# Patient Record
Sex: Male | Born: 1975 | Race: White | Hispanic: No | Marital: Single | Smoking: Former smoker
Health system: Southern US, Community
[De-identification: ages and names within clinical notes are randomized; demographics above are authoritative.]

## PROBLEM LIST (undated history)

## (undated) DIAGNOSIS — H5440 Blindness, one eye, unspecified eye: Secondary | ICD-10-CM

## (undated) DIAGNOSIS — I509 Heart failure, unspecified: Secondary | ICD-10-CM

## (undated) DIAGNOSIS — I639 Cerebral infarction, unspecified: Secondary | ICD-10-CM

## (undated) DIAGNOSIS — E669 Obesity, unspecified: Secondary | ICD-10-CM

## (undated) HISTORY — PX: EYE SURGERY: SHX253

---

## 2001-04-09 ENCOUNTER — Encounter: Payer: Self-pay | Admitting: Emergency Medicine

## 2001-04-09 ENCOUNTER — Emergency Department (HOSPITAL_COMMUNITY): Admission: EM | Admit: 2001-04-09 | Discharge: 2001-04-10 | Payer: Self-pay | Admitting: Emergency Medicine

## 2002-10-08 ENCOUNTER — Emergency Department (HOSPITAL_COMMUNITY): Admission: EM | Admit: 2002-10-08 | Discharge: 2002-10-08 | Payer: Self-pay | Admitting: Emergency Medicine

## 2002-12-31 ENCOUNTER — Emergency Department (HOSPITAL_COMMUNITY): Admission: EM | Admit: 2002-12-31 | Discharge: 2002-12-31 | Payer: Self-pay | Admitting: Emergency Medicine

## 2003-02-08 ENCOUNTER — Emergency Department (HOSPITAL_COMMUNITY): Admission: EM | Admit: 2003-02-08 | Discharge: 2003-02-08 | Payer: Self-pay | Admitting: Emergency Medicine

## 2003-04-13 ENCOUNTER — Encounter: Payer: Self-pay | Admitting: Emergency Medicine

## 2003-04-13 ENCOUNTER — Emergency Department (HOSPITAL_COMMUNITY): Admission: EM | Admit: 2003-04-13 | Discharge: 2003-04-14 | Payer: Self-pay

## 2003-04-14 ENCOUNTER — Encounter: Payer: Self-pay | Admitting: Emergency Medicine

## 2003-08-24 ENCOUNTER — Emergency Department (HOSPITAL_COMMUNITY): Admission: EM | Admit: 2003-08-24 | Discharge: 2003-08-24 | Payer: Self-pay | Admitting: Emergency Medicine

## 2003-09-28 ENCOUNTER — Emergency Department (HOSPITAL_COMMUNITY): Admission: EM | Admit: 2003-09-28 | Discharge: 2003-09-28 | Payer: Self-pay | Admitting: Emergency Medicine

## 2003-10-28 ENCOUNTER — Emergency Department (HOSPITAL_COMMUNITY): Admission: EM | Admit: 2003-10-28 | Discharge: 2003-10-28 | Payer: Self-pay | Admitting: Emergency Medicine

## 2005-04-27 ENCOUNTER — Emergency Department (HOSPITAL_COMMUNITY): Admission: EM | Admit: 2005-04-27 | Discharge: 2005-04-28 | Payer: Self-pay | Admitting: Emergency Medicine

## 2005-07-26 ENCOUNTER — Emergency Department (HOSPITAL_COMMUNITY): Admission: EM | Admit: 2005-07-26 | Discharge: 2005-07-26 | Payer: Self-pay | Admitting: Emergency Medicine

## 2005-07-29 ENCOUNTER — Emergency Department (HOSPITAL_COMMUNITY): Admission: EM | Admit: 2005-07-29 | Discharge: 2005-07-29 | Payer: Self-pay | Admitting: Family Medicine

## 2005-11-25 ENCOUNTER — Emergency Department (HOSPITAL_COMMUNITY): Admission: EM | Admit: 2005-11-25 | Discharge: 2005-11-25 | Payer: Self-pay | Admitting: Family Medicine

## 2005-12-31 ENCOUNTER — Emergency Department (HOSPITAL_COMMUNITY): Admission: EM | Admit: 2005-12-31 | Discharge: 2005-12-31 | Payer: Self-pay | Admitting: Emergency Medicine

## 2006-02-05 ENCOUNTER — Emergency Department (HOSPITAL_COMMUNITY): Admission: EM | Admit: 2006-02-05 | Discharge: 2006-02-05 | Payer: Self-pay | Admitting: Emergency Medicine

## 2006-02-07 ENCOUNTER — Emergency Department (HOSPITAL_COMMUNITY): Admission: EM | Admit: 2006-02-07 | Discharge: 2006-02-07 | Payer: Self-pay | Admitting: Emergency Medicine

## 2006-07-21 ENCOUNTER — Emergency Department (HOSPITAL_COMMUNITY): Admission: EM | Admit: 2006-07-21 | Discharge: 2006-07-21 | Payer: Self-pay | Admitting: Emergency Medicine

## 2006-09-08 ENCOUNTER — Ambulatory Visit: Payer: Self-pay | Admitting: Internal Medicine

## 2006-09-08 LAB — CONVERTED CEMR LAB
Basophils Absolute: 0 10*3/uL (ref 0.0–0.1)
Bilirubin, Direct: 0.1 mg/dL (ref 0.0–0.3)
CO2: 31 meq/L (ref 19–32)
Eosinophils Absolute: 0.2 10*3/uL (ref 0.0–0.6)
Eosinophils Relative: 3 % (ref 0.0–5.0)
GFR calc Af Amer: 126 mL/min
Glucose, Bld: 111 mg/dL — ABNORMAL HIGH (ref 70–99)
Hemoglobin, Urine: NEGATIVE
Ketones, ur: NEGATIVE mg/dL
Leukocytes, UA: NEGATIVE
Lymphocytes Relative: 33.2 % (ref 12.0–46.0)
MCHC: 34.9 g/dL (ref 30.0–36.0)
MCV: 83.6 fL (ref 78.0–100.0)
Mucus, UA: NEGATIVE
Neutro Abs: 3.8 10*3/uL (ref 1.4–7.7)
Neutrophils Relative %: 52.9 % (ref 43.0–77.0)
Platelets: 269 10*3/uL (ref 150–400)
Potassium: 4.2 meq/L (ref 3.5–5.1)
RBC / HPF: NONE SEEN
Sodium: 138 meq/L (ref 135–145)
TSH: 1.29 microintl units/mL (ref 0.35–5.50)
Total Protein, Urine: NEGATIVE mg/dL
Total Protein: 6.9 g/dL (ref 6.0–8.3)
Urine Glucose: NEGATIVE mg/dL
Urobilinogen, UA: 1 (ref 0.0–1.0)
WBC: 7.1 10*3/uL (ref 4.5–10.5)

## 2006-09-15 ENCOUNTER — Ambulatory Visit: Payer: Self-pay | Admitting: Internal Medicine

## 2006-09-22 ENCOUNTER — Encounter: Admission: RE | Admit: 2006-09-22 | Discharge: 2006-12-21 | Payer: Self-pay | Admitting: Internal Medicine

## 2006-09-29 ENCOUNTER — Encounter: Payer: Self-pay | Admitting: Cardiology

## 2006-09-29 ENCOUNTER — Ambulatory Visit: Payer: Self-pay

## 2006-10-17 ENCOUNTER — Emergency Department (HOSPITAL_COMMUNITY): Admission: EM | Admit: 2006-10-17 | Discharge: 2006-10-17 | Payer: Self-pay | Admitting: Family Medicine

## 2006-11-15 ENCOUNTER — Ambulatory Visit: Payer: Self-pay | Admitting: Internal Medicine

## 2006-11-16 ENCOUNTER — Ambulatory Visit: Payer: Self-pay | Admitting: Pulmonary Disease

## 2006-11-30 ENCOUNTER — Ambulatory Visit (HOSPITAL_BASED_OUTPATIENT_CLINIC_OR_DEPARTMENT_OTHER): Admission: RE | Admit: 2006-11-30 | Discharge: 2006-11-30 | Payer: Self-pay | Admitting: Pulmonary Disease

## 2006-12-13 ENCOUNTER — Ambulatory Visit: Payer: Self-pay | Admitting: Pulmonary Disease

## 2006-12-15 ENCOUNTER — Ambulatory Visit: Payer: Self-pay | Admitting: Pulmonary Disease

## 2007-01-05 ENCOUNTER — Emergency Department (HOSPITAL_COMMUNITY): Admission: EM | Admit: 2007-01-05 | Discharge: 2007-01-06 | Payer: Self-pay | Admitting: Emergency Medicine

## 2007-03-30 ENCOUNTER — Ambulatory Visit: Payer: Self-pay | Admitting: Internal Medicine

## 2007-03-31 DIAGNOSIS — J309 Allergic rhinitis, unspecified: Secondary | ICD-10-CM | POA: Insufficient documentation

## 2007-03-31 DIAGNOSIS — E785 Hyperlipidemia, unspecified: Secondary | ICD-10-CM | POA: Insufficient documentation

## 2007-03-31 DIAGNOSIS — E669 Obesity, unspecified: Secondary | ICD-10-CM

## 2007-03-31 DIAGNOSIS — G4733 Obstructive sleep apnea (adult) (pediatric): Secondary | ICD-10-CM

## 2007-03-31 DIAGNOSIS — G43909 Migraine, unspecified, not intractable, without status migrainosus: Secondary | ICD-10-CM | POA: Insufficient documentation

## 2007-04-08 ENCOUNTER — Encounter: Payer: Self-pay | Admitting: Internal Medicine

## 2007-04-19 ENCOUNTER — Emergency Department (HOSPITAL_COMMUNITY): Admission: EM | Admit: 2007-04-19 | Discharge: 2007-04-19 | Payer: Self-pay | Admitting: Emergency Medicine

## 2007-07-04 ENCOUNTER — Emergency Department (HOSPITAL_COMMUNITY): Admission: EM | Admit: 2007-07-04 | Discharge: 2007-07-04 | Payer: Self-pay | Admitting: Emergency Medicine

## 2007-08-01 ENCOUNTER — Encounter (INDEPENDENT_AMBULATORY_CARE_PROVIDER_SITE_OTHER): Payer: Self-pay | Admitting: *Deleted

## 2007-08-01 ENCOUNTER — Encounter: Payer: Self-pay | Admitting: Internal Medicine

## 2007-08-01 ENCOUNTER — Ambulatory Visit: Payer: Self-pay | Admitting: Internal Medicine

## 2007-08-01 DIAGNOSIS — R5383 Other fatigue: Secondary | ICD-10-CM

## 2007-08-01 DIAGNOSIS — R5381 Other malaise: Secondary | ICD-10-CM | POA: Insufficient documentation

## 2007-08-01 DIAGNOSIS — F329 Major depressive disorder, single episode, unspecified: Secondary | ICD-10-CM

## 2007-08-01 DIAGNOSIS — R109 Unspecified abdominal pain: Secondary | ICD-10-CM

## 2007-08-01 DIAGNOSIS — L738 Other specified follicular disorders: Secondary | ICD-10-CM

## 2007-08-01 DIAGNOSIS — F3289 Other specified depressive episodes: Secondary | ICD-10-CM | POA: Insufficient documentation

## 2007-08-01 DIAGNOSIS — F411 Generalized anxiety disorder: Secondary | ICD-10-CM

## 2007-08-01 LAB — CONVERTED CEMR LAB
Albumin: 3.8 g/dL (ref 3.5–5.2)
Alkaline Phosphatase: 63 units/L (ref 39–117)
BUN: 12 mg/dL (ref 6–23)
Basophils Relative: 0.9 % (ref 0.0–1.0)
Crystals: NEGATIVE
GFR calc Af Amer: 99 mL/min
GFR calc non Af Amer: 82 mL/min
Leukocytes, UA: NEGATIVE
Lymphocytes Relative: 33.1 % (ref 12.0–46.0)
Monocytes Relative: 9.7 % (ref 3.0–11.0)
Neutro Abs: 4.6 10*3/uL (ref 1.4–7.7)
Platelets: 280 10*3/uL (ref 150–400)
Potassium: 4.1 meq/L (ref 3.5–5.1)
RBC / HPF: NONE SEEN
RBC: 5.27 M/uL (ref 4.22–5.81)
Specific Gravity, Urine: 1.02 (ref 1.000–1.03)
Urine Glucose: NEGATIVE mg/dL
Urobilinogen, UA: 1 (ref 0.0–1.0)

## 2007-08-02 LAB — CONVERTED CEMR LAB

## 2007-08-30 ENCOUNTER — Emergency Department (HOSPITAL_COMMUNITY): Admission: EM | Admit: 2007-08-30 | Discharge: 2007-08-30 | Payer: Self-pay | Admitting: Family Medicine

## 2007-10-20 ENCOUNTER — Telehealth (INDEPENDENT_AMBULATORY_CARE_PROVIDER_SITE_OTHER): Payer: Self-pay | Admitting: *Deleted

## 2007-10-20 ENCOUNTER — Ambulatory Visit: Payer: Self-pay | Admitting: Internal Medicine

## 2007-10-20 DIAGNOSIS — M545 Low back pain, unspecified: Secondary | ICD-10-CM | POA: Insufficient documentation

## 2007-10-20 DIAGNOSIS — R21 Rash and other nonspecific skin eruption: Secondary | ICD-10-CM | POA: Insufficient documentation

## 2008-04-09 ENCOUNTER — Ambulatory Visit: Payer: Self-pay | Admitting: Internal Medicine

## 2008-04-09 DIAGNOSIS — G5600 Carpal tunnel syndrome, unspecified upper limb: Secondary | ICD-10-CM | POA: Insufficient documentation

## 2008-04-09 DIAGNOSIS — M25569 Pain in unspecified knee: Secondary | ICD-10-CM

## 2008-04-09 DIAGNOSIS — M5412 Radiculopathy, cervical region: Secondary | ICD-10-CM

## 2008-04-12 ENCOUNTER — Encounter: Admission: RE | Admit: 2008-04-12 | Discharge: 2008-04-12 | Payer: Self-pay | Admitting: Internal Medicine

## 2008-04-14 ENCOUNTER — Encounter: Admission: RE | Admit: 2008-04-14 | Discharge: 2008-04-14 | Payer: Self-pay | Admitting: Internal Medicine

## 2008-11-28 ENCOUNTER — Encounter: Payer: Self-pay | Admitting: Internal Medicine

## 2008-11-29 ENCOUNTER — Telehealth (INDEPENDENT_AMBULATORY_CARE_PROVIDER_SITE_OTHER): Payer: Self-pay | Admitting: *Deleted

## 2008-12-09 ENCOUNTER — Ambulatory Visit: Payer: Self-pay | Admitting: Endocrinology

## 2008-12-09 DIAGNOSIS — R0602 Shortness of breath: Secondary | ICD-10-CM | POA: Insufficient documentation

## 2008-12-09 DIAGNOSIS — R609 Edema, unspecified: Secondary | ICD-10-CM

## 2008-12-19 ENCOUNTER — Encounter: Payer: Self-pay | Admitting: Internal Medicine

## 2008-12-19 ENCOUNTER — Telehealth (INDEPENDENT_AMBULATORY_CARE_PROVIDER_SITE_OTHER): Payer: Self-pay | Admitting: *Deleted

## 2009-01-07 ENCOUNTER — Ambulatory Visit: Payer: Self-pay | Admitting: Cardiology

## 2009-01-07 DIAGNOSIS — R079 Chest pain, unspecified: Secondary | ICD-10-CM | POA: Insufficient documentation

## 2009-01-14 ENCOUNTER — Telehealth (INDEPENDENT_AMBULATORY_CARE_PROVIDER_SITE_OTHER): Payer: Self-pay | Admitting: Radiology

## 2009-01-15 ENCOUNTER — Ambulatory Visit: Payer: Self-pay

## 2009-01-15 ENCOUNTER — Encounter: Payer: Self-pay | Admitting: Cardiology

## 2009-01-16 ENCOUNTER — Encounter: Payer: Self-pay | Admitting: Cardiology

## 2009-01-16 ENCOUNTER — Ambulatory Visit: Payer: Self-pay

## 2009-01-16 ENCOUNTER — Encounter: Admission: RE | Admit: 2009-01-16 | Discharge: 2009-02-17 | Payer: Self-pay | Admitting: Cardiology

## 2009-01-20 ENCOUNTER — Encounter: Payer: Self-pay | Admitting: Cardiology

## 2009-02-03 ENCOUNTER — Ambulatory Visit: Payer: Self-pay | Admitting: Internal Medicine

## 2009-02-03 DIAGNOSIS — J209 Acute bronchitis, unspecified: Secondary | ICD-10-CM | POA: Insufficient documentation

## 2009-02-03 DIAGNOSIS — R062 Wheezing: Secondary | ICD-10-CM

## 2009-02-05 ENCOUNTER — Telehealth (INDEPENDENT_AMBULATORY_CARE_PROVIDER_SITE_OTHER): Payer: Self-pay | Admitting: *Deleted

## 2009-02-20 ENCOUNTER — Telehealth: Payer: Self-pay | Admitting: Internal Medicine

## 2009-02-27 ENCOUNTER — Emergency Department (HOSPITAL_COMMUNITY): Admission: EM | Admit: 2009-02-27 | Discharge: 2009-02-27 | Payer: Self-pay | Admitting: Emergency Medicine

## 2009-02-28 ENCOUNTER — Ambulatory Visit: Payer: Self-pay | Admitting: Internal Medicine

## 2009-02-28 ENCOUNTER — Inpatient Hospital Stay (HOSPITAL_COMMUNITY): Admission: EM | Admit: 2009-02-28 | Discharge: 2009-03-01 | Payer: Self-pay | Admitting: Emergency Medicine

## 2009-02-28 ENCOUNTER — Ambulatory Visit: Payer: Self-pay | Admitting: Cardiology

## 2009-03-01 ENCOUNTER — Telehealth (INDEPENDENT_AMBULATORY_CARE_PROVIDER_SITE_OTHER): Payer: Self-pay | Admitting: *Deleted

## 2009-03-06 ENCOUNTER — Ambulatory Visit: Payer: Self-pay | Admitting: Internal Medicine

## 2009-03-06 DIAGNOSIS — R55 Syncope and collapse: Secondary | ICD-10-CM

## 2009-03-13 ENCOUNTER — Telehealth (INDEPENDENT_AMBULATORY_CARE_PROVIDER_SITE_OTHER): Payer: Self-pay | Admitting: *Deleted

## 2009-03-21 ENCOUNTER — Encounter: Payer: Self-pay | Admitting: Cardiology

## 2009-03-27 ENCOUNTER — Ambulatory Visit: Payer: Self-pay | Admitting: Internal Medicine

## 2009-03-27 DIAGNOSIS — L989 Disorder of the skin and subcutaneous tissue, unspecified: Secondary | ICD-10-CM | POA: Insufficient documentation

## 2009-03-31 ENCOUNTER — Telehealth: Payer: Self-pay | Admitting: Internal Medicine

## 2009-04-01 ENCOUNTER — Telehealth: Payer: Self-pay | Admitting: Internal Medicine

## 2009-04-03 ENCOUNTER — Ambulatory Visit: Payer: Self-pay | Admitting: Internal Medicine

## 2009-04-29 ENCOUNTER — Telehealth: Payer: Self-pay | Admitting: Internal Medicine

## 2009-05-14 ENCOUNTER — Ambulatory Visit: Payer: Self-pay | Admitting: Cardiovascular Disease

## 2009-05-14 ENCOUNTER — Emergency Department (HOSPITAL_COMMUNITY): Admission: EM | Admit: 2009-05-14 | Discharge: 2009-05-14 | Payer: Self-pay | Admitting: Emergency Medicine

## 2009-06-27 ENCOUNTER — Telehealth: Payer: Self-pay | Admitting: Internal Medicine

## 2009-06-27 ENCOUNTER — Encounter: Payer: Self-pay | Admitting: Internal Medicine

## 2009-06-30 ENCOUNTER — Ambulatory Visit: Payer: Self-pay | Admitting: Internal Medicine

## 2009-07-02 ENCOUNTER — Telehealth (INDEPENDENT_AMBULATORY_CARE_PROVIDER_SITE_OTHER): Payer: Self-pay | Admitting: *Deleted

## 2009-11-28 ENCOUNTER — Emergency Department (HOSPITAL_COMMUNITY): Admission: EM | Admit: 2009-11-28 | Discharge: 2009-11-28 | Payer: Self-pay | Admitting: Emergency Medicine

## 2009-12-01 ENCOUNTER — Telehealth (INDEPENDENT_AMBULATORY_CARE_PROVIDER_SITE_OTHER): Payer: Self-pay | Admitting: *Deleted

## 2009-12-13 ENCOUNTER — Emergency Department (HOSPITAL_COMMUNITY): Admission: EM | Admit: 2009-12-13 | Discharge: 2009-12-14 | Payer: Self-pay | Admitting: Emergency Medicine

## 2010-01-09 ENCOUNTER — Emergency Department (HOSPITAL_COMMUNITY): Admission: EM | Admit: 2010-01-09 | Discharge: 2010-01-09 | Payer: Self-pay | Admitting: Emergency Medicine

## 2010-01-24 ENCOUNTER — Emergency Department (HOSPITAL_COMMUNITY): Admission: EM | Admit: 2010-01-24 | Discharge: 2010-01-24 | Payer: Self-pay | Admitting: Emergency Medicine

## 2010-04-22 ENCOUNTER — Observation Stay (HOSPITAL_COMMUNITY): Admission: EM | Admit: 2010-04-22 | Discharge: 2010-04-23 | Payer: Self-pay | Admitting: Emergency Medicine

## 2010-04-22 ENCOUNTER — Ambulatory Visit: Payer: Self-pay | Admitting: Nurse Practitioner

## 2010-05-25 ENCOUNTER — Ambulatory Visit (HOSPITAL_BASED_OUTPATIENT_CLINIC_OR_DEPARTMENT_OTHER): Admission: RE | Admit: 2010-05-25 | Discharge: 2010-05-25 | Payer: Self-pay | Admitting: Internal Medicine

## 2010-05-30 ENCOUNTER — Ambulatory Visit: Payer: Self-pay | Admitting: Internal Medicine

## 2010-06-09 ENCOUNTER — Emergency Department (HOSPITAL_COMMUNITY): Admission: EM | Admit: 2010-06-09 | Discharge: 2010-06-09 | Payer: Self-pay | Admitting: Emergency Medicine

## 2010-07-09 ENCOUNTER — Encounter: Payer: Self-pay | Admitting: Internal Medicine

## 2010-08-30 ENCOUNTER — Encounter: Payer: Self-pay | Admitting: Sports Medicine

## 2010-08-31 ENCOUNTER — Encounter: Payer: Self-pay | Admitting: Internal Medicine

## 2010-08-31 ENCOUNTER — Encounter: Payer: Self-pay | Admitting: Cardiology

## 2010-09-06 ENCOUNTER — Emergency Department (HOSPITAL_COMMUNITY)
Admission: EM | Admit: 2010-09-06 | Discharge: 2010-09-06 | Payer: Self-pay | Source: Home / Self Care | Admitting: Emergency Medicine

## 2010-09-06 LAB — DIFFERENTIAL
Basophils Absolute: 0 10*3/uL (ref 0.0–0.1)
Basophils Relative: 0 % (ref 0–1)
Eosinophils Relative: 3 % (ref 0–5)
Lymphocytes Relative: 30 % (ref 12–46)
Neutro Abs: 5.4 10*3/uL (ref 1.7–7.7)

## 2010-09-06 LAB — CBC
HCT: 45.1 % (ref 39.0–52.0)
RDW: 14.3 % (ref 11.5–15.5)
WBC: 9 10*3/uL (ref 4.0–10.5)

## 2010-09-06 LAB — COMPREHENSIVE METABOLIC PANEL
ALT: 29 U/L (ref 0–53)
BUN: 9 mg/dL (ref 6–23)
Calcium: 8.7 mg/dL (ref 8.4–10.5)
Creatinine, Ser: 0.88 mg/dL (ref 0.4–1.5)
GFR calc non Af Amer: >60 mL/min (ref >60)
Glucose, Bld: 98 mg/dL (ref 70–99)
Sodium: 135 mEq/L (ref 135–145)
Total Protein: 7.2 g/dL (ref 6.0–8.3)

## 2010-09-06 LAB — POCT CARDIAC MARKERS
CKMB, poc: <1 ng/mL — ABNORMAL LOW (ref 1.0–8.0)
Myoglobin, poc: 42.4 ng/mL (ref 12–200)

## 2010-09-06 LAB — CONVERTED CEMR LAB
Chloride: 104 meq/L (ref 96–112)
Potassium: 4.3 meq/L (ref 3.5–5.1)
Pro B Natriuretic peptide (BNP): 18 pg/mL (ref 0.0–100.0)
TSH: 1.77 microintl units/mL (ref 0.35–5.50)

## 2010-09-06 LAB — BRAIN NATRIURETIC PEPTIDE: Pro B Natriuretic peptide (BNP): 105 pg/mL — ABNORMAL HIGH (ref 0.0–100.0)

## 2010-09-10 NOTE — Progress Notes (Signed)
Summary: FYI   Phone Note Call from Patient   Caller: Patient Summary of Call: Per pt, pt was seen in the ER over the weekend and stated that he was to followup with our office. I checked in E-Chart and didnt see anything that stated that he needed to follow-up with Cardiology. I spoke with Beverlyn Roux concerning the letter for dismissal from [employer]. She instructed me to inform the patient of the dismissal and that there is nothing in E-Chart confirming this appt to be made. I instructed the patient that if he was still having chest pain that he needed to return to the ER or Urgent care. 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 I again instructed the patient that if he was having chest pain or the same symptoms please seek help from the ER. Initial call taken by: Edman Circle,  December 01, 2009 1:18 PM

## 2010-09-16 NOTE — Letter (Signed)
Summary: Certified Letter  Certified Letter   Imported By: Lenard Forth 09/08/2010 16:08:19  _____________________________________________________________________  External Attachment:    Type:   Image     Comment:   External Document

## 2010-10-20 LAB — POCT CARDIAC MARKERS
CKMB, poc: <1 ng/mL — ABNORMAL LOW (ref 1.0–8.0)
Myoglobin, poc: 34.6 ng/mL (ref 12–200)
Troponin i, poc: <0.05 ng/mL (ref 0.00–0.09)

## 2010-10-20 LAB — CBC
HCT: 45.9 % (ref 39.0–52.0)
Hemoglobin: 15.8 g/dL (ref 13.0–17.0)
MCH: 29.4 pg (ref 26.0–34.0)
MCHC: 34.4 g/dL (ref 30.0–36.0)
MCV: 85.5 fL (ref 78.0–100.0)
Platelets: 225 K/uL (ref 150–400)
RBC: 5.37 MIL/uL (ref 4.22–5.81)
RDW: 13.7 % (ref 11.5–15.5)
WBC: 7.7 10*3/uL (ref 4.0–10.5)

## 2010-10-20 LAB — BASIC METABOLIC PANEL
Calcium: 8.9 mg/dL (ref 8.4–10.5)
GFR calc Af Amer: >60 mL/min (ref >60)
GFR calc non Af Amer: >60 mL/min (ref >60)
Potassium: 3.9 mEq/L (ref 3.5–5.1)
Sodium: 139 mEq/L (ref 135–145)

## 2010-10-20 LAB — DIFFERENTIAL
Basophils Absolute: 0 K/uL (ref 0.0–0.1)
Basophils Relative: 0 % (ref 0–1)
Eosinophils Absolute: 0.2 K/uL (ref 0.0–0.7)
Eosinophils Relative: 2 % (ref 0–5)
Lymphocytes Relative: 34 % (ref 12–46)
Lymphs Abs: 2.6 K/uL (ref 0.7–4.0)
Monocytes Absolute: 0.7 10*3/uL (ref 0.1–1.0)
Monocytes Relative: 9 % (ref 3–12)
Neutro Abs: 4.3 10*3/uL (ref 1.7–7.7)
Neutrophils Relative %: 55 % (ref 43–77)

## 2010-10-20 LAB — BASIC METABOLIC PANEL WITH GFR
BUN: 13 mg/dL (ref 6–23)
CO2: 26 meq/L (ref 19–32)
Chloride: 106 meq/L (ref 96–112)
Creatinine, Ser: 0.83 mg/dL (ref 0.4–1.5)
Glucose, Bld: 104 mg/dL — ABNORMAL HIGH (ref 70–99)

## 2010-10-20 LAB — BRAIN NATRIURETIC PEPTIDE: Pro B Natriuretic peptide (BNP): <30 pg/mL (ref 0.0–100.0)

## 2010-10-22 LAB — BASIC METABOLIC PANEL
CO2: 26 mEq/L (ref 19–32)
Calcium: 8.7 mg/dL (ref 8.4–10.5)
Creatinine, Ser: 0.88 mg/dL (ref 0.4–1.5)
GFR calc Af Amer: >60 mL/min (ref >60)
GFR calc non Af Amer: >60 mL/min (ref >60)
Glucose, Bld: 112 mg/dL — ABNORMAL HIGH (ref 70–99)
Sodium: 139 mEq/L (ref 135–145)

## 2010-10-22 LAB — URINALYSIS, ROUTINE W REFLEX MICROSCOPIC
Bilirubin Urine: NEGATIVE
Glucose, UA: NEGATIVE mg/dL
Ketones, ur: NEGATIVE mg/dL
Specific Gravity, Urine: 1.026 (ref 1.005–1.030)
pH: 7 (ref 5.0–8.0)

## 2010-10-22 LAB — LIPID PANEL
Cholesterol: 166 mg/dL (ref 0–200)
LDL Cholesterol: 107 mg/dL — ABNORMAL HIGH (ref 0–99)
Triglycerides: 139 mg/dL (ref <150)
VLDL: 28 mg/dL (ref 0–40)

## 2010-10-22 LAB — CARDIAC PANEL(CRET KIN+CKTOT+MB+TROPI)
Relative Index: INVALID (ref 0.0–2.5)
Total CK: 52 U/L (ref 7–232)
Total CK: 54 U/L (ref 7–232)

## 2010-10-22 LAB — DIFFERENTIAL
Basophils Absolute: 0 10*3/uL (ref 0.0–0.1)
Basophils Relative: 0 % (ref 0–1)
Lymphocytes Relative: 36 % (ref 12–46)
Neutro Abs: 4.4 10*3/uL (ref 1.7–7.7)

## 2010-10-22 LAB — POCT CARDIAC MARKERS
CKMB, poc: <1 ng/mL — ABNORMAL LOW (ref 1.0–8.0)
Myoglobin, poc: 28.9 ng/mL (ref 12–200)
Troponin i, poc: <0.05 ng/mL (ref 0.00–0.09)

## 2010-10-22 LAB — CK TOTAL AND CKMB (NOT AT ARMC)
Relative Index: INVALID (ref 0.0–2.5)
Total CK: 61 U/L (ref 7–232)

## 2010-10-22 LAB — CBC
Hemoglobin: 14.3 g/dL (ref 13.0–17.0)
MCH: 29.3 pg (ref 26.0–34.0)
MCHC: 34.2 g/dL (ref 30.0–36.0)
Platelets: 212 10*3/uL (ref 150–400)
RBC: 4.88 MIL/uL (ref 4.22–5.81)

## 2010-10-22 LAB — PROTIME-INR: Prothrombin Time: 12.9 seconds (ref 11.6–15.2)

## 2010-10-22 LAB — TROPONIN I: Troponin I: 0.01 ng/mL (ref 0.00–0.06)

## 2010-10-25 LAB — POCT CARDIAC MARKERS
Myoglobin, poc: 72 ng/mL (ref 12–200)
Troponin i, poc: <0.05 ng/mL (ref 0.00–0.09)

## 2010-10-25 LAB — BASIC METABOLIC PANEL
BUN: 10 mg/dL (ref 6–23)
CO2: 27 mEq/L (ref 19–32)
Chloride: 102 mEq/L (ref 96–112)
Creatinine, Ser: 1 mg/dL (ref 0.4–1.5)
Glucose, Bld: 106 mg/dL — ABNORMAL HIGH (ref 70–99)
Potassium: 4.3 mEq/L (ref 3.5–5.1)

## 2010-10-25 LAB — CBC
HCT: 46.5 % (ref 39.0–52.0)
MCHC: 34.3 g/dL (ref 30.0–36.0)
MCV: 85.4 fL (ref 78.0–100.0)
Platelets: 232 10*3/uL (ref 150–400)
RDW: 14.8 % (ref 11.5–15.5)
WBC: 9.7 10*3/uL (ref 4.0–10.5)

## 2010-10-25 LAB — DIFFERENTIAL
Basophils Absolute: 0.1 10*3/uL (ref 0.0–0.1)
Basophils Relative: 1 % (ref 0–1)
Eosinophils Absolute: 0.2 10*3/uL (ref 0.0–0.7)
Eosinophils Relative: 2 % (ref 0–5)
Lymphs Abs: 3.4 10*3/uL (ref 0.7–4.0)
Neutrophils Relative %: 52 % (ref 43–77)

## 2010-10-26 LAB — POCT I-STAT, CHEM 8
BUN: 14 mg/dL (ref 6–23)
Creatinine, Ser: 0.9 mg/dL (ref 0.4–1.5)
Glucose, Bld: 100 mg/dL — ABNORMAL HIGH (ref 70–99)
Potassium: 3.4 mEq/L — ABNORMAL LOW (ref 3.5–5.1)
Sodium: 142 mEq/L (ref 135–145)

## 2010-10-26 LAB — DIFFERENTIAL
Eosinophils Relative: 2 % (ref 0–5)
Lymphocytes Relative: 29 % (ref 12–46)
Lymphs Abs: 2.5 10*3/uL (ref 0.7–4.0)
Monocytes Relative: 10 % (ref 3–12)
Neutrophils Relative %: 59 % (ref 43–77)

## 2010-10-26 LAB — POCT CARDIAC MARKERS
CKMB, poc: <1 ng/mL — ABNORMAL LOW (ref 1.0–8.0)
Myoglobin, poc: 60.2 ng/mL (ref 12–200)

## 2010-10-26 LAB — CBC
HCT: 47.5 % (ref 39.0–52.0)
MCV: 85.2 fL (ref 78.0–100.0)
Platelets: 263 10*3/uL (ref 150–400)
RBC: 5.58 MIL/uL (ref 4.22–5.81)
WBC: 8.7 10*3/uL (ref 4.0–10.5)

## 2010-10-26 LAB — BRAIN NATRIURETIC PEPTIDE: Pro B Natriuretic peptide (BNP): <30 pg/mL (ref 0.0–100.0)

## 2010-10-27 LAB — BRAIN NATRIURETIC PEPTIDE: Pro B Natriuretic peptide (BNP): 45 pg/mL (ref 0.0–100.0)

## 2010-10-27 LAB — DIFFERENTIAL
Basophils Relative: 1 % (ref 0–1)
Eosinophils Absolute: 0.2 10*3/uL (ref 0.0–0.7)
Lymphocytes Relative: 38 % (ref 12–46)
Lymphocytes Relative: 22 % (ref 12–46)
Lymphs Abs: 3.3 10*3/uL (ref 0.7–4.0)
Lymphs Abs: 3.5 10*3/uL (ref 0.7–4.0)
Lymphs Abs: 2.3 10*3/uL (ref 0.7–4.0)
Monocytes Absolute: 0.7 10*3/uL (ref 0.1–1.0)
Monocytes Absolute: 0.9 10*3/uL (ref 0.1–1.0)
Monocytes Relative: 9 % (ref 3–12)
Monocytes Relative: 8 % (ref 3–12)
Neutro Abs: 4.4 10*3/uL (ref 1.7–7.7)
Neutro Abs: 4.6 10*3/uL (ref 1.7–7.7)
Neutro Abs: 7.1 10*3/uL (ref 1.7–7.7)
Neutrophils Relative %: 51 % (ref 43–77)
Neutrophils Relative %: 67 % (ref 43–77)

## 2010-10-27 LAB — POCT CARDIAC MARKERS
CKMB, poc: <1 ng/mL — ABNORMAL LOW (ref 1.0–8.0)
CKMB, poc: <1 ng/mL — ABNORMAL LOW (ref 1.0–8.0)
CKMB, poc: <1 ng/mL — ABNORMAL LOW (ref 1.0–8.0)
Myoglobin, poc: 38.8 ng/mL (ref 12–200)
Myoglobin, poc: 50.2 ng/mL (ref 12–200)
Troponin i, poc: <0.05 ng/mL (ref 0.00–0.09)

## 2010-10-27 LAB — BASIC METABOLIC PANEL
CO2: 26 mEq/L (ref 19–32)
CO2: 24 mEq/L (ref 19–32)
Calcium: 8.8 mg/dL (ref 8.4–10.5)
Chloride: 104 mEq/L (ref 96–112)
Creatinine, Ser: 0.95 mg/dL (ref 0.4–1.5)
GFR calc Af Amer: >60 mL/min (ref >60)
GFR calc Af Amer: >60 mL/min (ref >60)
GFR calc non Af Amer: >60 mL/min (ref >60)
Sodium: 139 mEq/L (ref 135–145)
Sodium: 140 mEq/L (ref 135–145)

## 2010-10-27 LAB — MAGNESIUM: Magnesium: 2.2 mg/dL (ref 1.5–2.5)

## 2010-10-27 LAB — PHOSPHORUS: Phosphorus: 3.9 mg/dL (ref 2.3–4.6)

## 2010-10-27 LAB — CBC
Hemoglobin: 15 g/dL (ref 13.0–17.0)
Hemoglobin: 15.6 g/dL (ref 13.0–17.0)
MCHC: 34.7 g/dL (ref 30.0–36.0)
MCHC: 35.2 g/dL (ref 30.0–36.0)
MCV: 85.7 fL (ref 78.0–100.0)
Platelets: 223 10*3/uL (ref 150–400)
RBC: 4.99 MIL/uL (ref 4.22–5.81)
RBC: 5.05 MIL/uL (ref 4.22–5.81)
RBC: 5.19 MIL/uL (ref 4.22–5.81)
WBC: 8.8 10*3/uL (ref 4.0–10.5)
WBC: 9.1 10*3/uL (ref 4.0–10.5)
WBC: 10.6 10*3/uL — ABNORMAL HIGH (ref 4.0–10.5)

## 2010-10-27 LAB — APTT: aPTT: 27 seconds (ref 24–37)

## 2010-10-27 LAB — PROTIME-INR
INR: 1.01 (ref 0.00–1.49)
Prothrombin Time: 13.2 seconds (ref 11.6–15.2)

## 2010-11-03 ENCOUNTER — Observation Stay (HOSPITAL_COMMUNITY)
Admission: EM | Admit: 2010-11-03 | Discharge: 2010-11-04 | Disposition: A | Discharge status: Home / Self Care | Payer: Self-pay | Attending: Internal Medicine | Admitting: Internal Medicine

## 2010-11-03 ENCOUNTER — Emergency Department (HOSPITAL_COMMUNITY): Payer: Self-pay

## 2010-11-03 DIAGNOSIS — G8929 Other chronic pain: Secondary | ICD-10-CM | POA: Insufficient documentation

## 2010-11-03 DIAGNOSIS — F329 Major depressive disorder, single episode, unspecified: Secondary | ICD-10-CM | POA: Insufficient documentation

## 2010-11-03 DIAGNOSIS — F17200 Nicotine dependence, unspecified, uncomplicated: Secondary | ICD-10-CM | POA: Insufficient documentation

## 2010-11-03 DIAGNOSIS — R61 Generalized hyperhidrosis: Secondary | ICD-10-CM | POA: Insufficient documentation

## 2010-11-03 DIAGNOSIS — R609 Edema, unspecified: Secondary | ICD-10-CM | POA: Insufficient documentation

## 2010-11-03 DIAGNOSIS — I10 Essential (primary) hypertension: Secondary | ICD-10-CM | POA: Insufficient documentation

## 2010-11-03 DIAGNOSIS — R0989 Other specified symptoms and signs involving the circulatory and respiratory systems: Secondary | ICD-10-CM | POA: Insufficient documentation

## 2010-11-03 DIAGNOSIS — F3289 Other specified depressive episodes: Secondary | ICD-10-CM | POA: Insufficient documentation

## 2010-11-03 DIAGNOSIS — G4733 Obstructive sleep apnea (adult) (pediatric): Secondary | ICD-10-CM | POA: Insufficient documentation

## 2010-11-03 DIAGNOSIS — R0609 Other forms of dyspnea: Secondary | ICD-10-CM | POA: Insufficient documentation

## 2010-11-03 DIAGNOSIS — M549 Dorsalgia, unspecified: Secondary | ICD-10-CM | POA: Insufficient documentation

## 2010-11-03 DIAGNOSIS — R079 Chest pain, unspecified: Principal | ICD-10-CM | POA: Insufficient documentation

## 2010-11-03 DIAGNOSIS — M25569 Pain in unspecified knee: Secondary | ICD-10-CM | POA: Insufficient documentation

## 2010-11-03 LAB — DIFFERENTIAL
Basophils Absolute: 0.1 10*3/uL (ref 0.0–0.1)
Basophils Relative: 1 % (ref 0–1)
Eosinophils Relative: 2 % (ref 0–5)
Monocytes Absolute: 0.8 10*3/uL (ref 0.1–1.0)
Monocytes Relative: 8 % (ref 3–12)
Neutro Abs: 5.4 10*3/uL (ref 1.7–7.7)

## 2010-11-03 LAB — CK TOTAL AND CKMB (NOT AT ARMC)
CK, MB: 1.4 ng/mL (ref 0.3–4.0)
Relative Index: INVALID (ref 0.0–2.5)
Total CK: 76 U/L (ref 7–232)

## 2010-11-03 LAB — COMPREHENSIVE METABOLIC PANEL
AST: 21 U/L (ref 0–37)
Albumin: 3.7 g/dL (ref 3.5–5.2)
Alkaline Phosphatase: 50 U/L (ref 39–117)
BUN: 12 mg/dL (ref 6–23)
CO2: 26 mEq/L (ref 19–32)
Chloride: 104 mEq/L (ref 96–112)
GFR calc Af Amer: >60 mL/min (ref >60)
GFR calc non Af Amer: >60 mL/min (ref >60)
Potassium: 3.4 mEq/L — ABNORMAL LOW (ref 3.5–5.1)
Total Bilirubin: 0.5 mg/dL (ref 0.3–1.2)

## 2010-11-03 LAB — D-DIMER, QUANTITATIVE: D-Dimer, Quant: <0.22 ug/mL-FEU (ref 0.00–0.48)

## 2010-11-03 LAB — TROPONIN I: Troponin I: <0.01 ng/mL (ref 0.00–0.06)

## 2010-11-03 LAB — CBC
Hemoglobin: 15.4 g/dL (ref 13.0–17.0)
MCH: 29.2 pg (ref 26.0–34.0)
MCHC: 34.5 g/dL (ref 30.0–36.0)
RDW: 13.9 % (ref 11.5–15.5)

## 2010-11-03 LAB — RAPID URINE DRUG SCREEN, HOSP PERFORMED: Tetrahydrocannabinol: NOT DETECTED

## 2010-11-03 LAB — POCT CARDIAC MARKERS: Myoglobin, poc: 42 ng/mL (ref 12–200)

## 2010-11-03 LAB — CARDIAC PANEL(CRET KIN+CKTOT+MB+TROPI): Troponin I: 0.01 ng/mL (ref 0.00–0.06)

## 2010-11-04 ENCOUNTER — Other Ambulatory Visit (HOSPITAL_COMMUNITY): Payer: Self-pay

## 2010-11-04 DIAGNOSIS — I517 Cardiomegaly: Secondary | ICD-10-CM

## 2010-11-04 LAB — CARDIAC PANEL(CRET KIN+CKTOT+MB+TROPI)
CK, MB: 1 ng/mL (ref 0.3–4.0)
Total CK: 69 U/L (ref 7–232)

## 2010-11-04 LAB — VITAMIN B12: Vitamin B-12: 283 pg/mL (ref 211–911)

## 2010-11-04 LAB — TSH: TSH: 1.235 u[IU]/mL (ref 0.350–4.500)

## 2010-11-11 NOTE — H&P (Signed)
NAMEDAMEAN, POFFENBERGER NO.:  [id number]  MEDICAL RECORD NO.:  [id number]           PATIENT TYPE:  E  LOCATION:  [facility]                         FACILITY:  [facility]  PHYSICIAN:  Jeoffrey Massed, MD    DATE OF BIRTH:  [birth year]  DATE OF ADMISSION:  11/03/2010 DATE OF DISCHARGE:                             HISTORY & PHYSICAL   PRIMARY CARE PRACTITIONER:  Ralene Ok, MD.  PRIMARY CARDIOLOGIST:  Madolyn Frieze. Jens Som, MD, [facility].  CHIEF COMPLAINT:  Chest pain since last night around 10-11 p.m.  HISTORY OF PRESENT ILLNESS:  The patient is a very pleasant 35 year old Caucasian male with a past medical history of morbid obesity, history of obstructive sleep apnea on CPAP at home, history of chronic bilateral leg edema on Lasix, dyslipidemia, history of chronic back pain, and bilateral knee pain comes into the ED with the above-noted complaint. Per the patient, he was in his usual state of health.  He started having retrosternal chest pain around 10 or 11 p.m. last night.  Initially, the pain was only around 4-5/10, however, this morning the pain increased to around 9/10 and hence the patient presented to the ED.  The patient describes the pain as pressure like and it comes and goes and lasts from anywhere between 10-20 minutes.  It is not related to any exertion. When the pain first started, the patient was sitting on his couch.  This morning, he claims he had some associated shortness of breath.  He denies any nausea, vomiting, or palpitations.  He at times has diaphoresis with the pain.  He claims the pain at times radiates to his left arm and at times it does not.  Currently, the patient feels significantly better.  He does not have any further shortness of breath. His pain is currently around 4-5/10.  The patient denies any headache or any fever.  He denies any recent infections or hospitalizations.  He denies any abdominal pain.  He denies any  diarrhea.  He denies that the pain is pleuritic.  The pain is not reproducible on palpation as well.  ALLERGIES:  None.  PAST MEDICAL HISTORY: 1. Morbid obesity. 2. Chronic bilateral leg edema. 3. Questionable CHF. 4. Dyslipidemia. 5. Obstructive sleep apnea, uses CPAP at home. 6. History of depression. 7. History of anxiety. 8. History of morbid obesity.  PAST SURGICAL HISTORY:  None.  MEDICATIONS AT HOME:  This is unverified, however, the patient is taking, 1. Aspirin 81 mg p.o. daily. 2. Lasix at an unknown dose. 3. Simvastatin at an unknown dose. 4. He claims he has recently been put on Nexium, which he has not     started taking yet. 5. He takes hydrocodone/Tylenol 7.5/500 mg as well, the frequency is     not known currently.  FAMILY HISTORY:  He claims his grandfather and grandmother both had heart issues when they were in the 58s; however, his parents did not have any heart issues at all.  SOCIAL HISTORY:  He works in [employer].  He does smoke cigars every now and then.  Up to a few years ago, he used to be a  heavy smoker.  He denies any other toxic habits.  REVIEW OF SYSTEMS:  A detailed review of 12 systems were done and these are negative except for the ones mentioned in the HPI.  PHYSICAL EXAM:  VITAL SIGNS:  Temperature of 97.6, heart rate of 78, blood pressure 106/51, respirations of 19, and pulse oximetry of 100% on room air. GENERAL:  An obese young white male lying in bed, does not appear to be in any distress, easily speaking in full sentences, not using any accessory muscles of respiration, is awake and alert. HEENT:  Atraumatic, normocephalic.  Pupils are equally reactive to light and accommodation. NECK:  Short and thick.  Unable to evaluate for JVD given body habitus. CHEST:  There is decreased air entry secondary to body habitus, but the patient has clear breath sounds. CARDIOVASCULAR:  Heart sounds are regular.  No murmurs heard. ABDOMEN:   Obese, soft, nontender, nondistended, good bowel sounds. EXTREMITIES:  There is trace bilateral pedal edema.  Both legs are warm to touch and bilateral pedal pulses are palpable. NEUROLOGIC:  The patient is alert and oriented x3 and he has no focal neurologic deficits on exam.  LABORATORY DATA:  CBC shows WBC of 9.2, hemoglobin of 15.4, hematocrit of 44.7, platelet count of 227.  Point-of-care cardiac markers are negative.  D-dimer is less than 0.22.  Chemistry shows sodium of 138, potassium of 3.4, chloride of 104, bicarb of 26, glucose of 101, BUN of 12, creatinine of 0.82, and a calcium of 8.7.  LFT shows a total bilirubin of 0.5, alkaline phosphatase of 50, AST of 21, ALT of 27, total protein of 7.1, albumin of 3.7.  RADIOLOGICAL STUDIES:  Chest x-ray portable shows no active lung disease.  Heart is within normal limits and size.  No bony abnormalities are seen.  EKG has normal sinus rhythm.  ASSESSMENT: 1. Chest pain with some typical as well as atypical features starting     since yesterday with a normal EKG and initial cardiac markers     negative. 2. Morbid obesity. 3. Dyslipidemia. 4. Obstructive sleep apnea, on CPAP at home. 5. Chronic bilateral lower extremity edema.  PLAN: 1. This patient will be brought in as an observation. 2. Cardiac enzymes will be cycled.  A 2-D echocardiogram will be     obtained. 3. Review of his E-chart did reveal that this patient was admitted     with similar complaints in September 2011 and was discharged after     a negative stress test.  The patient claims he had a stress test     probably in 2010 as well, which is also negative. 4. We will continue his aspirin and statin. 5. We will follow his clinical course and assess and monitor this     patient closely. 6. If his cardiac enzymes were to come positive, then Cardiology will     be consulted. 7. He will also be started on a proton pump inhibitor. 8. He will be given a GI cocktail  and MiraLax to use on a p.r.n.     basis. 9. DVT prophylaxis with Lovenox.  Total time spent 45 minutes.     Jeoffrey Massed, MD     SG/MEDQ  D:  11/03/2010  T:  11/03/2010  Job:  [id number]  cc:   Ralene Ok, M.D. Madolyn Frieze Jens Som, MD, [facility]  Electronically Signed by Jeoffrey Massed  on 11/11/2010 04:46:24 AM

## 2010-11-12 LAB — CBC
HCT: 46.3 % (ref 39.0–52.0)
Hemoglobin: 15.8 g/dL (ref 13.0–17.0)
Platelets: 247 10*3/uL (ref 150–400)
WBC: 8 10*3/uL (ref 4.0–10.5)

## 2010-11-12 LAB — DIFFERENTIAL
Eosinophils Relative: 3 % (ref 0–5)
Lymphocytes Relative: 33 % (ref 12–46)
Lymphs Abs: 2.7 10*3/uL (ref 0.7–4.0)
Monocytes Absolute: 0.7 10*3/uL (ref 0.1–1.0)

## 2010-11-12 LAB — BASIC METABOLIC PANEL
GFR calc non Af Amer: >60 mL/min (ref >60)
Potassium: 3.5 mEq/L (ref 3.5–5.1)
Sodium: 141 mEq/L (ref 135–145)

## 2010-11-12 LAB — BRAIN NATRIURETIC PEPTIDE: Pro B Natriuretic peptide (BNP): <30 pg/mL (ref 0.0–100.0)

## 2010-11-12 LAB — POCT CARDIAC MARKERS: Troponin i, poc: <0.05 ng/mL (ref 0.00–0.09)

## 2010-11-15 LAB — CBC
HCT: 42.5 % (ref 39.0–52.0)
HCT: 44.3 % (ref 39.0–52.0)
Hemoglobin: 14.8 g/dL (ref 13.0–17.0)
Platelets: 203 10*3/uL (ref 150–400)
Platelets: 230 10*3/uL (ref 150–400)
RDW: 14.7 % (ref 11.5–15.5)
WBC: 6.8 10*3/uL (ref 4.0–10.5)
WBC: 9.2 10*3/uL (ref 4.0–10.5)

## 2010-11-15 LAB — COMPREHENSIVE METABOLIC PANEL
AST: 19 U/L (ref 0–37)
Albumin: 3.7 g/dL (ref 3.5–5.2)
Calcium: 8.9 mg/dL (ref 8.4–10.5)
Chloride: 104 mEq/L (ref 96–112)
Creatinine, Ser: 0.98 mg/dL (ref 0.4–1.5)
GFR calc Af Amer: >60 mL/min (ref >60)
Total Bilirubin: 0.6 mg/dL (ref 0.3–1.2)
Total Protein: 7.3 g/dL (ref 6.0–8.3)

## 2010-11-15 LAB — POCT CARDIAC MARKERS
CKMB, poc: <1 ng/mL — ABNORMAL LOW (ref 1.0–8.0)
Myoglobin, poc: 61.4 ng/mL (ref 12–200)
Troponin i, poc: <0.05 ng/mL (ref 0.00–0.09)

## 2010-11-15 LAB — BASIC METABOLIC PANEL
BUN: 19 mg/dL (ref 6–23)
Calcium: 8.7 mg/dL (ref 8.4–10.5)
GFR calc non Af Amer: >60 mL/min (ref >60)
Potassium: 3.8 mEq/L (ref 3.5–5.1)
Sodium: 136 mEq/L (ref 135–145)

## 2010-11-15 LAB — LIPID PANEL
Cholesterol: 178 mg/dL (ref 0–200)
LDL Cholesterol: 123 mg/dL — ABNORMAL HIGH (ref 0–99)
Triglycerides: 124 mg/dL (ref <150)
VLDL: 25 mg/dL (ref 0–40)

## 2010-11-15 LAB — APTT: aPTT: 26 seconds (ref 24–37)

## 2010-11-15 LAB — DIFFERENTIAL
Eosinophils Relative: 1 % (ref 0–5)
Lymphocytes Relative: 31 % (ref 12–46)
Lymphs Abs: 2.8 10*3/uL (ref 0.7–4.0)
Monocytes Absolute: 0.9 10*3/uL (ref 0.1–1.0)
Monocytes Relative: 10 % (ref 3–12)

## 2010-11-15 LAB — CK TOTAL AND CKMB (NOT AT ARMC)
CK, MB: 0.8 ng/mL (ref 0.3–4.0)
Relative Index: INVALID (ref 0.0–2.5)
Total CK: 43 U/L (ref 7–232)
Total CK: 47 U/L (ref 7–232)

## 2010-11-15 LAB — PROTIME-INR
INR: 1 (ref 0.00–1.49)
Prothrombin Time: 13.4 seconds (ref 11.6–15.2)

## 2010-11-25 ENCOUNTER — Ambulatory Visit: Payer: Self-pay | Admitting: *Deleted

## 2010-12-03 NOTE — Discharge Summary (Signed)
  Richard Benson, Richard Benson           ACCOUNT NO.:  [id number]  MEDICAL RECORD NO.:  [id number]           PATIENT TYPE:  O  LOCATION:  2003                         FACILITY:  [facility]  PHYSICIAN:  Makari Sanko I Digna Countess, MD      DATE OF BIRTH:  10/18/1975  DATE OF ADMISSION:  11/03/2010 DATE OF DISCHARGE:  11/04/2010                              DISCHARGE SUMMARY   PRIMARY CARE PHYSICIAN:  Ralene Ok, MD  PRIMARY CARDIOLOGIST:  Madolyn Frieze. Jens Som, MD, [facility]  DISCHARGE DIAGNOSES: 1. Atypical chest pain, resolved.  The patient has recent      normal exercise Myoview.  Question of mild reversible defect in the     anterior apical region, but this appeared artifact.  EF 60-65% on     echo with no wall motion abnormality.  There is no region wall     motion abnormality.  Left ventricle diastolic function parameter     were normal. 2. History of chronic pain. 3. Morbid obesity. 4. Chronic bilateral leg edema. 5. Questionable diastolic dysfunction. 6. Dyslipidemia. 7. Obstructive sleep apnea on CPAP. 8. History of depression. 9. History of anxiety. 10.History of morbid obesity.  CONSULTATION:  None.  PROCEDURE:  Chest x-ray, no active lung disease.  A 2-D echo systolic function normal, EF 60-65%, wall motion within normal.  There is no regional wall motion abnormality, left ventricular diastolic function parameters are normal.  The atrium is mildly dilated.  EKG normal sinus rhythm.  HISTORY OF PRESENT ILLNESS:  This is a 35 year old Caucasian male with past medical history of morbid obesity, history of obstructive sleep apnea with CPAP presented with chest pain pressure like and it come and goes and lasts from anywhere between 10-20 minutes.  He states he has some diaphoresis, no shortness of breath and no fever.  He had an EKG, which is normal sinus rhythm.  The patient admitted to telemetry, cycle cardiac enzyme, which were negative.  There is no obvious lower extremity edema.  The  patient was advised to follow up with his MD, Dr. Ralene Ok, and his cardiologist for further risk stratification. 1. Hypokalemia replacement.  The patient would resume his home     medication.  Currently we felt, the patient is stable for discharge.     Nairi Oswald Bosie Helper, MD     HIE/MEDQ  D:  12/01/2010  T:  12/02/2010  Job:  [id number]  cc:   Ralene Ok, M.D. Madolyn Frieze Jens Som, MD, [facility]  Electronically Signed by Ebony Cargo MD on 12/03/2010 09:03:56 AM

## 2010-12-22 NOTE — Discharge Summary (Signed)
Richard Benson, Richard Benson           ACCOUNT NO.:  [id number]   MEDICAL RECORD NO.:  [id number]          PATIENT TYPE:  INP   LOCATION:  4705                         FACILITY:  [facility]   PHYSICIAN:  Zakar Brosch. Norins, MD  DATE OF BIRTH:  [birth year]   DATE OF ADMISSION:  02/28/2009  DATE OF DISCHARGE:  03/01/2009                               DISCHARGE SUMMARY   ADMITTING DIAGNOSES:  1. Syncope.  2. Shortness of breath and chest pain.  3. Obstructive sleep apnea.  4. Morbid obesity.  5. Hyperlipidemia.  6. Allergic rhinitis.  7. History of migraines.  8. History of depression.  9. History of cervical radiculopathy.   DISCHARGE DIAGNOSES:  1. Syncope with no repeat episodes.  2. Chest pain with cardiac enzymes negative.  3. Obstructive sleep apnea.  4. Morbid obesity.  5. Hyperlipidemia.  6. Allergic rhinitis.  7. History of migraines.  8. History of depression.  9. History of cervical radiculopathy.   CONSULTANTS:  Cardiology; Gerrit Friends. Dietrich Pates, MD, [facility] and Noralyn Pick.  Eden Emms, MD, [facility]   PROCEDURES:  1. CT scan of the brain performed on the day of admission, which      showed normal noncontrast appearance of the brain.  Mild nasal      sinus inflammatory changes.  Probable sebaceous cyst, left premalar      soft tissues.  2. Chest x-ray performed on July 23 with no acute disease.  3. X-rays of sacrum and coccyx, which showed no fracture.  Sacroiliac      joints were normal.   HISTORY OF PRESENT ILLNESS:  The patient is a 35 year old gentleman, who  presented to the emergency department after an episode of chest pain  earlier in the day.  He was walking out of a restaurant and had the  onset of pain that radiated to his neck and down his arm associated with  left arm numbness and weakness as well as left lower extremity numbness  and weakness.  He had shortness of breath with this episode and  sensation of feeling choked.  He also reports that he started feeling  very  lightheaded and had a transient loss of consciousness.  He reports  he had a similar episode of loss of consciousness that was very brief in  nature on the previous Saturday.  Because of these symptoms, he was  admitted to rule out for cardiac arrhythmia or cardiac event.   Please see the H and P for details of the past medical history, family  history, social history, and examination at admission.   HOSPITAL COURSE:  1. Syncope.  The patient remained in normal sinus rhythm on telemetry.      He was up and moving about during the past 24 hours with no      recurrent episodes of lightheadedness or near-syncope.  CT scan of      the brain was normal.  No further evaluation indicated at this      time, but he will need to follow up with his outpatient primary      care physician.  2. Cardiovascular.  The patient with  chest pain that was atypical in      nature.  EKG was unremarkable for any acute changes.  On admission      EKG by machine was read out as atrial fibrillation, by my      interpretation this appeared to be more of a sinus arrhythmia with      P waves that are visible.  He remained in sinus rhythm during his      hospital stay on telemetry.  Cardiac enzymes were negative.      Cardiology consult was called.  They felt that no further      cardiology evaluation was indicated.  The patient had recurrent      discomfort.  Given his morbid obesity, he may be a candidate for      outpatient stress testing.  3. Obstructive sleep apnea.  The patient is noncompliant with use of      his CPAP mask.  He is adamantly encouraged to continue using CPAP      on a regular basis.  4. Morbid obesity.  The patient will need outpatient nutrition      consult.  5. Hyperlipidemia.  Cholesterol level was checked with a cholesterol      of 178, LDL of 123, and HDL of 30.  This is close to goal.  Of      note, he is on no antilipemic medications.  We will defer      medication decision to his  outpatient primary care physician.  6. Allergic rhinitis was stable.  7. Migraines.  The patient has had no migraines while in the hospital.  8. Depression is stable.  9. Cervical radiculopathy is stable.   With the patient having ruled out for cardiac event with him having no  recurrent syncopal episodes with no evidence arrhythmia on 24 hours of  telemetry monitoring and a normal CT of the brain, he is thought to be  stable and ready for discharge home.   DISCHARGE EXAMINATION:  VITAL SIGNS:  Temperature 97.8, blood pressure  134/56, heart rate 59, respirations 20, and O2 sats 97% on room air.  GENERAL APPEARANCE:  This is an obese Caucasian male, lying in bed, in  no acute distress.  CHEST:  The patient is moving air well with no wheezing.  No increased  work of breathing.  CARDIOVASCULAR:  Precordium is quiet.  His heart rate was regular.  I  appreciated no murmurs.  ABDOMEN:  Morbidly obese.  NEUROLOGIC:  The patient is awake, alert, and oriented to person, place,  time, and context.  His speech is fluent.  Cognition seems normal.  Cranial nerves II through XII are grossly intact with normal facial  symmetry and movement.  Muscle strength, the patient is moving all  extremities spontaneously.   FINAL LABORATORY DATA:  Lipid profile as noted.  Basic metabolic panel  on the day of discharge was sodium 136, potassium 3.8, chloride 102, CO2  of 27, BUN of 19, creatinine 0.88, and glucose 91.  Hemoglobin A1c  normal at 5.4%.  CBC from the day of discharge with a hemoglobin of 14.8  g and white count of 6800.  Cardiac markers in the emergency department  at admission were negative with a troponin I of less than 0.05.  Follow  up cardiac panel with a CK of 47, troponin 0.02, and CK of 43.  The  patient had a B-natriuretic peptide at admission, which was normal at  43.  D-dimer was  negative.   DISCHARGE MEDICATIONS:  The patient will resume all of his home  medications without  change including:  1. Potassium 20 mEq b.i.d.  2. Hydrocodone/APAP 10/325 q.6 h. p.r.n.  3. Flexeril 5 mg t.i.d. p.r.n.  4. Furosemide 120 mg in a.m. and 80 mg in p.m.  5. Proventil metered-dose inhaler 2 puffs q.6 h. p.r.n.   The patient's condition at the time of discharge dictation is medically  stable.  He will be followed up by Dr. Oliver Barre in 7-10 days and  office will contact the patient with an appointment.  The patient's  condition is stable at the time of discharge dictation.      Rosalyn Gess Norins, MD  Electronically Signed     MEN/MEDQ  D:  03/01/2009  T:  03/01/2009  Job:  [id number]   cc:   Corwin Levins, MD  Gerrit Friends. Dietrich Pates, MD, [facility]

## 2010-12-22 NOTE — Consult Note (Signed)
NAMEJANSEL, VONSTEIN           ACCOUNT NO.:  [id number]   MEDICAL RECORD NO.:  [id number]          PATIENT TYPE:  INP   LOCATION:  4705                         FACILITY:  [facility]   PHYSICIAN:  Gerrit Friends. Dietrich Pates, MD, FACCDATE OF BIRTH:  Aug 09, 1976   DATE OF CONSULTATION:  DATE OF DISCHARGE:                                 CONSULTATION   REFERRING PHYSICIAN:  Doug Sou, MD   PRIMARY CARE PHYSICIAN:  Dr. Jonny Ruiz.   PRIMARY CARDIOLOGIST:  Madolyn Frieze. Jens Som, MD, [facility]   HISTORY OF PRESENT ILLNESS:  A 35 year old gentleman who has been  evaluated in the past for chest pain and has recently had a negative  echocardiogram and negative stress nuclear study now presents with chest  discomfort, left-sided paresthesias or sensory loss, and syncope.  Mr.  Rihn has no significant history of cardiovascular disease.  He has  had chronic pedal edema.  He has sleep apnea, but is not known to have  pulmonary hypertension.  He has used positive pressure device at night,  but has recently had problems with this.  He describes an episode today  of feeling a sensation of choking while eating.  This apparently  resolved.  He subsequently walked out of the restaurant to the parking  lot where he experienced numbness in the entire left side of his body,  then a severe stabbing pain in the mid chest and then apparent loss of  consciousness with a fall and lack of appreciation what was transpiring  around him until EMS came and began to work with him.  He describes no  radiation of his pain.  He describes no dyspnea.  He has had no  orthopnea nor PND.  He works in [employer] and has been working in a  warm environment.  He takes diuretics for his pedal edema.  He has been  told to follow low salt and low fluid diet.   He experienced a similar episode approximately a week ago, again with a  fall and with chest pain.  He has had a sensation of choking on food or  inability to swallow on at least  one occasion in the past as well.   PAST MEDICAL HISTORY:  Notable for longstanding obesity.  He has been  seen in the emergency department on numerous occasions, most recently  yesterday.  These are generally for minor issues.  His problem yesterday  was pain in the region of his coccus related to the fall, he suffered 6  days ago.  X-rays were negative.  He was treated with analgesics.   He has no history of hypertension nor diabetes.  He has had  hyperlipidemia, carpal tunnel nerve impingement, cervical radiculopathy,  anxiety, folliculitis, depression, allergic rhinitis, and multiple  surgical procedures on his left eye following trauma.  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   SOCIAL HISTORY:  Divorced and lives alone; modest tobacco consumption;  no use of alcohol nor drugs.   FAMILY HISTORY:  Mother is alive, but her health status is unknown.  His  father is alive and well.  He does have a positive family history for  cardiac problems in second-degree relatives.   CURRENT MEDICATIONS:  1. KCl 20 mEq daily.  2. Hydrocodone/acetaminophen q.6 h. p.r.n  3. Flexeril 5 mg t.i.d.  4. Furosemide 100 mg q.a.m. and 80 mg q.p.m.  5. Proventil p.r.n.   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  He has chronic pedal edema that is only partially  controlled with high-dose furosemide.  He has arthritic discomfort,  notably left knee pain.  He has chronic low back pain.  He has had at  least 2 dozen transports via EMS for a wide variety of complaints for  which no organic disease has been identified.   PHYSICAL EXAMINATION:  GENERAL:  On exam, somewhat reserved gentleman  who is sometimes slow to respond and who is in no acute distress.  VITAL SIGNS:  The  blood pressure is 110/60, heart rate 60 and regular,  respirations 20, temperature 97.8, O2 saturation 100% on nasal O2.  HEENT:  EOMs full; pupils equal, round, and reactive to light; normal  lids and conjunctivae; normal oral mucosa.  NECK:  No jugular venous distention; normal carotid upstrokes without  bruits.  ENDOCRINE:  No thyromegaly.  HEMATOPOIETIC:  No adenopathy.  SKIN:  No significant lesions.  LUNGS:  Clear.  CARDIAC:  Normal first and second heart sounds; normal PMI.  ABDOMEN:  Soft and nontender; normal bowel sounds; no masses; no  organomegaly.  EXTREMITIES:  1/2+ ankle edema; normal distal pulses.  NEUROLOGIC:  Symmetric strength and tone; normal cranial nerves.  PSYCHIATRIC:  Alert and oriented; somewhat flat, perhaps depressed  affect.   Initial laboratories negative including cardiac markers, CBC, chemistry  profile, chest x-ray, D-dimer, and LFTs.   IMPRESSION:  Mr. Weil presents with paresthesias or numbness over  one half of his body.  It is unlikely that one specific lesion would  account for these symptoms.  He certainly could have had cerebral  hypoperfusion, but I would not expect such as a sharp division between  symptomatic and non-symptomatic parts of his body.   He had a sharp episode of chest pain that was fairly brief.  His  likelihood of coronary disease is quite low, especially with a recent  negative stress nuclear study.  Serial cardiac markers will be obtained.  I would not fully anticoagulate him for these symptoms, which are likely  noncardiac.   He appears to be suffering from loss of consciousness, but he is not  entirely clear on this.  No one is present now who has witnessed one of  these spells.  Myocardial infarction, significant arrhythmia, or  pulmonary embolus are unlikely, particularly with his negative tests so  far.  Monitoring overnight, assessment of orthostatic blood pressure,  and standard lab tests will be an  appropriate evaluation for this  problem.   His EKG was interpreted by the computerized reading system as atrial  fibrillation.  No atrial fibrillation is present.  No abnormalities are  present except for rightward axis, prominent inferior Q-waves.  In the  setting of normal left ventricular systolic function by recent  echocardiogram and by a nuclear study, a prior inferior myocardial  infarction is unlikely.   We will be happy to follow this gentleman with you in the hospital.  Hopefully, he can  be discharged in the morning.      Gerrit Friends. Dietrich Pates, MD, [facility]  Electronically Signed     RMR/MEDQ  D:  02/28/2009  T:  03/01/2009  Job:  [id number]

## 2010-12-22 NOTE — H&P (Signed)
NAMEZANDER, Richard Benson NO.:  [id number]   MEDICAL RECORD NO.:  [id number]          PATIENT TYPE:  INP   LOCATION:  4705                         FACILITY:  [facility]   PHYSICIAN:  Corwin Levins, MD      DATE OF BIRTH:  03-28-1976   DATE OF ADMISSION:  02/28/2009  DATE OF DISCHARGE:                              HISTORY & PHYSICAL   CHIEF COMPLAINT:  Chest pain.   HISTORY OF PRESENT ILLNESS:  Richard Benson is a 35 year old white male,  who presents following episode of chest pain today.  This occurred while  he was walking out of a restaurant.  The chest pain radiated up to his  neck and down his arm.  It is associated with left arm numbness and  weakness as well as left lower extremity numbness and weakness.  He also  noted severe shortness of breath during this episode and a sensation of  feeling choked.  He had a similar event yesterday, however, not as  severe.  He also notes an episode last Saturday approximately 1 week ago  where he had a syncopal event and fell injuring his coccyx.  He notes  that he has not been using a CPAP for the last week mainly due to  anxiety following a power outage that he experienced with a CPAP mask.  We are asked to admit the patient for further evaluation and treatment.   ALLERGIES:  None.   PAST MEDICAL HISTORY:  1. Hyperlipidemia.  2. Morbid obesity.  3. Carpal tunnel syndrome.  4. Cervical radiculopathy.  5. Anxiety.  6. Folliculitis.  7. Depression.  8. Obstructive sleep apnea.  9. Allergic rhinitis.  10.Migraines.   PAST SURGICAL HISTORY:  Eye surgery x9 secondary to a traumatic injury  to left eye.   SOCIAL HISTORY:  He is single.  He lives alone.  He smokes occasionally.  Denies alcohol or drugs.  He is a self-employed [employer].   FAMILY HISTORY:  Mother is alive, health history is unknown.  He does  note heart condition in his grandparents.  His dad is alive and well.   MEDICATIONS:  1. Klor-Con 20  mEq p.o. b.i.d.  2. Hydrocodone with acetaminophen 10/325 p.o. q.6 h. p.r.n.  3. Flexeril 5 mg p.o. t.i.d. p.r.n.  4. Furosemide 120 mg p.o. in the morning and 80 mg p.o. in the p.m.  5. Proventil MDI 2 puffs 4 times daily as needed.   REVIEW OF SYSTEMS:  GENERAL:  No fever.  No chills.  CARDIOVASCULAR:  No  palpitations or chronic lower extremity edema.  RESPIRATORY:  Positive  for chest congestion, nonproductive.  He does note that it feels like  his throat is swelling.  GI:  No nausea, vomiting, or diarrhea.  GU:  No  dysuria, poor urine output.  PSYCHIATRIC:  No depression or anxiety.  SKIN:  No rashes.  Left-sided weakness, he notes to be resolved during  review of systems.  MUSCULOSKELETAL:  No muscle or joint pain, however,  he does have pain in the tailbone.  HEMATOLOGY:  No bleeding.   LABS/RADIOLOGY:  Hemoglobin 14.1,  hematocrit 44.3, white blood cell  count 9.2, platelets 230.  BUN 17, creatinine 0.98.  AST and ALT normal.  A 2-D echo on January 15, 2009, notes normal left ventricular ejection  fraction of 60%.  Point-of-care cardiac enzymes are negative x2.   PHYSICAL EXAMINATION:  VITAL SIGNS:  BP 108/60, heart rate 56,  respiratory rate 20, temperature 97.8, and O2 sat 100% on room air.  HEENT:  Left pupil is fixed, history of trauma.  He has no lip or tongue  swelling noted.  Moist oral mucosa.  GENERAL:  Awake, alert, morbidly obese white male in no acute distress.  NECK:  Thick secondary to obesity.  CARDIOVASCULAR:  S1 and S2.  Regular rate and rhythm.  EXTREMITIES:  2+ bilateral lower extremity edema is noted.  LUNGS:  Breath sounds are clear to auscultation bilaterally without  wheezes, rales, or rhonchi.  No increased work of breathing is noted.  ABDOMEN:  Soft, nontender, positive bowel sounds.  PSYCHIATRIC:  A and O x3, pleasant.  NEUROLOGIC:  Strong equal hand grasps.  He is noted to have left lower  extremity 4-5/5 strength.   ASSESSMENT/PLAN:  1. Chest  pain/shortness of breath with a questionable syncopal event.      EKG notes atrial fibrillation which is a new diagnosis for this      patient.  He also has some left-sided weakness.  We will cycle      cardiac enzymes x3.  Monitor on telemetry.  We will also request a      Cardiology consult.  We will check a D-dimer as well as a BNP.  The      patient had left-sided weakness and continued to have left lower      extremity weakness.  We will CT his head to rule out      cerebrovascular accident.  We will start him on full-dose Lovenox,      pharmacy to dose.  2. Chronic lower extremity edema, question right-sided heart failure,      although the patient had an echo on January 15, 2009, which noted      poorly visualized right ventricle and normal ejection fraction.  We      will continue his Lasix.  3. Severe obstructive sleep apnea.  He has history of recent      noncompliance.  We will plan to resume here as tolerated.  4. Morbid obesity.  We will request nutrition consult.  5. Hyperlipidemia.  We will check fasting lipid profile in the a.m.      The patient is not currently on statin.  6. Allergic rhinitis, this is stable.  7. History of migraines, this is stable.  8. Anxiety/depression, stable.  9. History of cervical radiculopathy, stable.      Sandford Craze, NP      Corwin Levins, MD  Electronically Signed    MO/MEDQ  D:  02/28/2009  T:  03/01/2009  Job:  [id number]

## 2010-12-24 ENCOUNTER — Inpatient Hospital Stay (HOSPITAL_COMMUNITY)
Admission: EM | Admit: 2010-12-24 | Discharge: 2010-12-26 | DRG: 313 | Disposition: A | Discharge status: Home / Self Care | Payer: Self-pay | Attending: Internal Medicine | Admitting: Internal Medicine

## 2010-12-24 DIAGNOSIS — F17200 Nicotine dependence, unspecified, uncomplicated: Secondary | ICD-10-CM | POA: Diagnosis present

## 2010-12-24 DIAGNOSIS — R0789 Other chest pain: Principal | ICD-10-CM | POA: Diagnosis present

## 2010-12-24 DIAGNOSIS — G8929 Other chronic pain: Secondary | ICD-10-CM | POA: Diagnosis present

## 2010-12-24 DIAGNOSIS — K219 Gastro-esophageal reflux disease without esophagitis: Secondary | ICD-10-CM | POA: Diagnosis present

## 2010-12-24 DIAGNOSIS — G4733 Obstructive sleep apnea (adult) (pediatric): Secondary | ICD-10-CM | POA: Diagnosis present

## 2010-12-24 DIAGNOSIS — E785 Hyperlipidemia, unspecified: Secondary | ICD-10-CM | POA: Diagnosis present

## 2010-12-24 DIAGNOSIS — Z7982 Long term (current) use of aspirin: Secondary | ICD-10-CM

## 2010-12-24 DIAGNOSIS — F341 Dysthymic disorder: Secondary | ICD-10-CM | POA: Diagnosis present

## 2010-12-25 ENCOUNTER — Emergency Department (HOSPITAL_COMMUNITY): Payer: Self-pay

## 2010-12-25 LAB — POCT I-STAT, CHEM 8
BUN: 18 mg/dL (ref 6–23)
Calcium, Ion: 1.02 mmol/L — ABNORMAL LOW (ref 1.12–1.32)
Chloride: 106 mEq/L (ref 96–112)
Glucose, Bld: 114 mg/dL — ABNORMAL HIGH (ref 70–99)
TCO2: 25 mmol/L (ref 0–100)

## 2010-12-25 LAB — BASIC METABOLIC PANEL
CO2: 29 mEq/L (ref 19–32)
Calcium: 9.1 mg/dL (ref 8.4–10.5)
GFR calc Af Amer: >60 mL/min (ref >60)
Glucose, Bld: 103 mg/dL — ABNORMAL HIGH (ref 70–99)
Potassium: 3.8 mEq/L (ref 3.5–5.1)
Sodium: 141 mEq/L (ref 135–145)

## 2010-12-25 LAB — PRO B NATRIURETIC PEPTIDE: Pro B Natriuretic peptide (BNP): 6.3 pg/mL (ref 0–125)

## 2010-12-25 LAB — CARDIAC PANEL(CRET KIN+CKTOT+MB+TROPI)
CK, MB: 1.3 ng/mL (ref 0.3–4.0)
Relative Index: INVALID (ref 0.0–2.5)
Total CK: 68 U/L (ref 7–232)
Total CK: 67 U/L (ref 7–232)
Total CK: 67 U/L (ref 7–232)
Troponin I: <0.3 ng/mL (ref <0.30)

## 2010-12-25 LAB — CBC
HCT: 41.4 % (ref 39.0–52.0)
Hemoglobin: 14.3 g/dL (ref 13.0–17.0)
MCHC: 34.5 g/dL (ref 30.0–36.0)
RBC: 4.95 MIL/uL (ref 4.22–5.81)
WBC: 9.2 10*3/uL (ref 4.0–10.5)

## 2010-12-25 LAB — LIPID PANEL
Cholesterol: 151 mg/dL (ref 0–200)
HDL: 34 mg/dL — ABNORMAL LOW (ref >39)
Triglycerides: 133 mg/dL (ref <150)
VLDL: 27 mg/dL (ref 0–40)

## 2010-12-25 LAB — POCT CARDIAC MARKERS
CKMB, poc: <1 ng/mL — ABNORMAL LOW (ref 1.0–8.0)
Myoglobin, poc: 60.5 ng/mL (ref 12–200)
Troponin i, poc: <0.05 ng/mL (ref 0.00–0.09)

## 2010-12-25 LAB — D-DIMER, QUANTITATIVE: D-Dimer, Quant: 0.24 ug/mL-FEU (ref 0.00–0.48)

## 2010-12-25 LAB — MAGNESIUM: Magnesium: 2.2 mg/dL (ref 1.5–2.5)

## 2010-12-25 NOTE — Assessment & Plan Note (Signed)
Richard Benson                             PULMONARY OFFICE NOTE   [name], [name]                  MRN:          [id number]  DATE:11/16/2006                            DOB:          05-06-1976    SLEEP MEDICINE CONSULTATION:   HISTORY OF PRESENT ILLNESS:  The patient is a 35 year old male who I  have been asked to see for possible sleep apnea.  The patient has been  told that he has loud snoring and pauses in his breathing during sleep.  He also described classic choking arousals.  He typically gets to bed  between 10:00 and 11:00 p.m. and gets up between 7:00 to 8:00 a.m. to  start his day.  He is tired in the morning whenever he arises.  The  patient notes significant inappropriate daytime sleepiness with periods  of inactivity and will often fall asleep with TV or movies in the  evening.  He does have some sleepiness with driving.  Of note, his  weight is up about 100 pounds over the last two years.   PAST MEDICAL HISTORY:  1. Significant for allergic rhinitis.  2. Chronic headaches, though it is not contributory.   MEDICATIONS:  1. Lasix 40 mg b.i.d.  2. Potassium SR 10 mg t.i.d.   ALLERGIES:  No known drug allergies.   SOCIAL HISTORY:  He is single.  He does not have children.  He has a  history of smoking in the past, but has not done so since 2003.   FAMILY HISTORY:  Totally unremarkable in first degree relatives.   REVIEW OF SYSTEMS:  As per history of present illness.  Also, see  patient intake form documented on the chart.   PHYSICAL EXAMINATION:  GENERAL:  He is a morbidly obese white male in no  acute distress.  VITAL SIGNS:  Blood pressure 110/72, pulse 85, temperature 98, weight  349 pounds, oxygen saturation on room air is 97%.  HEENT:  Pupils equal, round and reactive to light and accommodation.  Extraocular movements intact.  Nares shows deviated septum to the left.  Oropharynx does show significant hypertrophy  with his tonsils with  sidewall narrowing and elongation of the soft palate uvula.  NECK:  Large and difficult to assess for JVD.  There is no  lymphadenopathy or thyromegaly.  CHEST:  Totally clear.  CARDIAC:  Regular rate and rhythm.  No murmurs, rubs or gallops.  ABDOMEN:  Soft, nontender with good bowel sounds.  GENERAL/RECTAL/BREAST:  Not done and not indicated.  EXTREMITIES:  Lower extremities show trace edema.  Pulses are intact  distally.  NEUROLOGICAL:  Alert and oriented with no obvious motor deficits.   IMPRESSION:  Probable severe obstructive sleep apnea.  The patient gives  a very good history for this, is morbidly obese and has normal upper  airway anatomy.  Had a long discussion with him about the  pathophysiology of sleep apnea including the short term quality of life  issues and the long-term cardiovascular issues.  At this point in time,  I think a sleep study needs to be done for diagnosis followed  by  appropriate treatment.  The patient is agreeable to this approach.   PLAN:  1. Schedule for MPSG.  2. Work on weight loss.  3. I have reminded the patient of his moral responsibility to not      drive if he is sleepy.  4. The patient will follow up after the above.     Barbaraann Share, MD,FCCP  Electronically Signed    [facility]/Med[facility]  DD: 12/13/2006  DT: 12/14/2006  Job #: [id number]   cc:   Corwin Levins, MD

## 2010-12-25 NOTE — Procedures (Signed)
NAMEHAYDAN, Richard Benson NO.:  [id number]   MEDICAL RECORD NO.:  [id number]          PATIENT TYPE:  OUT   LOCATION:  SLEEP CENTER                 FACILITY:  [facility]   PHYSICIAN:  Barbaraann Share, MD,FCCPDATE OF BIRTH:  01/[birth year]74   DATE OF STUDY:  11/30/2006                            NOCTURNAL POLYSOMNOGRAM   REFERRING PHYSICIAN:   INDICATION FOR STUDY:  Hypersomnia with sleep apnea.   EPWORTH SLEEPINESS SCORE:  16.   SLEEP ARCHITECTURE:  The patient had a total sleep time of 376 minutes  with very decreased slow wave sleep in REM.  Sleep onset latency was  normal and REM onset was very prolonged at 353 minutes.  Sleep  efficiency was decreased at 87%.   RESPIRATORY DATA:  The patient was found to have 241 hypopneas and 40  obstructive apneas for an apnea-hypopnea index of 45 events per hour.  The events occurred more commonly in the supine position.  There was  very loud snoring noted throughout.   OXYGEN DATA:  There was O2 desaturation as low as 87% with the patient's  obstructive events.   CARDIAC DATA:  No clinically significant cardiac arrhythmias were noted.   MOVEMENT-PARASOMNIA:  The patient was found to have 57 leg jerks with  3.8 per hour resulting in arousal or awakening.   IMPRESSIONS-RECOMMENDATIONS:  1. Severe obstructive sleep apnea/hypopnea syndrome with an apnea-      hypopnea index of 45 events per hour and O2 desaturation as low as      87%.  Treatment for this degree of sleep apnea should focus      primarily on weight loss as well as CPAP.  2. Moderate numbers of leg jerks with some degree of sleep disruption.      I suspect this is related to the patient's      sleep disordered breathing, however clinical correlation is      suggested if the patient does not respond appropriately to      treatment of his obstructive sleep apnea.      Barbaraann Share, MD,FCCP  Diplomate, American Board of Sleep  Medicine  Electronically  Signed     [facility]/MED[facility]  D:  12/14/2006 12:18:28  T:  12/14/2006 13:59:21  Job:  [id number]

## 2010-12-26 LAB — BASIC METABOLIC PANEL
BUN: 13 mg/dL (ref 6–23)
Calcium: 8.7 mg/dL (ref 8.4–10.5)
Creatinine, Ser: 0.7 mg/dL (ref 0.4–1.5)
GFR calc non Af Amer: >60 mL/min (ref >60)
Glucose, Bld: 82 mg/dL (ref 70–99)

## 2010-12-26 LAB — CBC
HCT: 42.1 % (ref 39.0–52.0)
MCHC: 32.8 g/dL (ref 30.0–36.0)
MCV: 84.9 fL (ref 78.0–100.0)
Platelets: 199 10*3/uL (ref 150–400)
RDW: 13.9 % (ref 11.5–15.5)

## 2010-12-28 LAB — FOLATE RBC: RBC Folate: 870 ng/mL — ABNORMAL HIGH (ref >=366)

## 2011-01-07 NOTE — H&P (Signed)
Richard Benson           ACCOUNT NO.:  [id number]  MEDICAL RECORD NO.:  [id number]           PATIENT TYPE:  I  LOCATION:  2039                         FACILITY:  [facility]  PHYSICIAN:  Richard Benson, M.D.      DATE OF BIRTH:  [birth year]  DATE OF ADMISSION:  12/24/2010 DATE OF DISCHARGE:                             HISTORY & PHYSICAL   PRIMARY CARE PHYSICIAN:  Richard Ok, MD  PRESENTING COMPLAINT:  Chest pain.  HISTORY OF PRESENT ILLNESS:  The patient is well known to our service with known history of recurrent chest pain, which is at present to be atypical.  He was last admitted on November 03, 2010 and also November 17, 2010.  He came in secondary to sudden onset of chest pain.  Per the patient, chest pain was centrally located and then radiating down his left arm associated with shortness of breath.  Per the patient, it just hit him while he was "sitting down."  It was associated with some nausea and he almost vomited.  The patient is known to be a smoker who also has last stress test 2 years ago.  He has never had a cardiac cath.  The pain is now said to be 6/10.  He responded to some nitro.  He also has risk factors for coronary artery disease including morbid obesity, dyslipidemia, and tobacco abuse.  PAST MEDICAL HISTORY:  Significant for: 1. Chest pain. 2. Morbid obesity. 3. Chronic bilateral left lower extremity edema. 4. Questionable diastolic dysfunction. 5. Hyperlipidemia. 6. Obstructive sleep apnea. 7. History of depression and anxiety. 8. GERD. 9. Chronic pain.  ALLERGIES:  He has no known drug allergies.  MEDICATIONS:  The patient is supposed to be on aspirin 81 mg daily, Lasix, simvastatin, and some PPI.  He apparently has not been taking his medications adequately.  SOCIAL HISTORY:  The patient lives in [place].  He smokes about half- a-pack per day, mainly cigars.  He works in [employer].  He was heavy smoker until recently.  Denied any alcohol or  IV drug use.  FAMILY HISTORY:  The patient's grandfather and grandmother both had heart issues in their 59s.  His parents are okay.  REVIEW OF SYSTEMS:  All systems reviewed are currently negative except for HPI.  PHYSICAL EXAMINATION:  VITAL SIGNS:  Temperature 97.5, Benson pressure 129/58, pulse 75, respiratory rate 20, sats 93% on room air. GENERAL:  He is awake, alert, oriented, morbidly obese man.  He is in no acute distress. HEENT:  PERRLA.  EOMI.  No pallor.  No jaundice.  No rhinorrhea. NECK:  Supple.  No JVD.  No lymphadenopathy. RESPIRATORY:  He has good air entry bilaterally.  No wheezes.  No rales. CARDIOVASCULAR SYSTEM:  He has S1-S2, no audible murmur. ABDOMEN:  Obese, soft, nontender with positive bowel sound. EXTREMITIES:  Showed trace edema. SKIN:  No visible rashes or ulcers.  LABORATORY DATA:  Sodium is 141, potassium 3.5, chloride 106, glucose 114, BUN 18, creatinine 1.0.  His white count is 9.2, hemoglobin 14.3, platelet 233.  Initial cardiac markers are negative.  Pro-BNP is 6.3.  D- dimer is 0.24.  Initial  cardiac enzymes are negative.  EKG showed normal sinus rhythm with no significant ST-T wave changes.  ASSESSMENT:  This is a 35 year old gentleman with recurrent chest pain presenting with acute chest pain.  PLAN: 1. Chest pain.  We will admit the patient to tele.  Serial cardiac     enzymes.  Repeat EKG in the morning.  The patient chest x-ray so     for is negative for any acute disease.  He has not had any recent     stress test, his last one was 2 years ago and he has never been     cath.  We will therefore get Cardiology to follow with the patient     with [place] and may be repeat a stress test. 2. Obstructive sleep apnea.  We will continue his CPAP. 3. Gastroesophageal reflux disease.  I will continue his PPI in the     hospital. 4. Morbid obesity.  The patient is aware of his current condition and     the need for his weight reduction. 5.  Hyperlipidemia.  We will check fasting lipid panel and consider     continue with simvastatin.     Richard Benson, M.D.     Verlin Grills  D:  12/25/2010  T:  12/25/2010  Job:  [id number]  Electronically Signed by Richard Benson M.D. on 01/07/2011 11:47:24 AM

## 2011-01-10 NOTE — Discharge Summary (Signed)
Richard Benson, Richard Benson           ACCOUNT NO.:  [id number]  MEDICAL RECORD NO.:  [id number]           PATIENT TYPE:  I  LOCATION:  2039                         FACILITY:  [facility]  PHYSICIAN:  Kathlen Mody, MD       DATE OF BIRTH:  [birth year]  DATE OF ADMISSION:  12/25/2010 DATE OF DISCHARGE:  12/26/2010                              DISCHARGE SUMMARY   DISCHARGE DIAGNOSES: 1. Atypical chest pain most likely musculoskeletal. 2. Morbid obesity. 3. Bilateral lower extremity edema. 4. Hyperlipidemia. 5. Obstructive sleep apnea. 6. Depression. 7. Anxiety. 8. Gastroesophageal reflux disease. 9. Chronic pain syndrome.  DISCHARGE MEDICATIONS: 1. Tylenol 325 mg 1 tablet daily. 2. Ibuprofen 400 mg q.8 h for 10 days. 3. Nexium 40 mg 1 tablet twice a day. 4. Aspirin 81 mg daily. 5. Lasix 2 tablets twice daily. 6. Hydrocodone 5/325 one tablet q.6 h. 7. Dulera  1-2 puffs twice daily as needed. 8. Simvastatin 10 mg 1 tablet daily. 9. Symbicort 1-2 puffs twice daily. 10.Potassium chloride tablets 10 mEq twice daily.  PERTINENT LABS:  On the day of admission, the patient had a CBC done which was within normal limits.  Point-of-care cardiac markers were negative.  Pro-BNP was 6.3.  D-dimers were negative.  Next set of cardiac enzymes negative.  Next pro-BNP was normal.  Basic metabolic panel significant for a glucose of 103.  Lipid profile shows significant for HDL of 34.  Next set of cardiac enzymes within normal limits.  TSH was normal.  Next set of cardiac enzymes were within normal limits. Vitamin B12 level was within normal limits.  CBC was within normal limits.  Basic metabolic panel was within normal limits.  Folate, RBC was within normal limits.  PERTINENT RADIOLOGY:  The patient had a chest x-ray which showed no active cardiopulmonary disease.  The patient had an echocardiogram done on November 04, 2010, which shows an ejection fraction of 60-65%, no regional wall abnormalities.   Doppler parameters were normal.  Left ventricular diastolic functions were normal and the right ventricular size and systolic function was normal. No valvular dysfunction at this time.  No pericardial effusion.  BRIEF HOSPITAL COURSE:  This is a 35 year old gentleman with history of atypical chest pain, bilateral lower extremity edema, obstructive sleep apnea, depression, anxiety and chronic pain syndrome, was admitted for chest pain worsening on taking a deep breath and intended to call the patient on the chest most likely atypical secondary to musculoskeletal pain, but the patient was admitted to telemetry to rule out acute coronary syndromes.  Cardiac enzymes were negative.  EKG was normal sinus rhythm.  Echocardiogram 2 months ago was within normal limits. The patient had a stress test done in September 2011 which was negative for ischemia.  Over the course of the next 24 hours, the patient's chest pain has resolved with pain medication and he was discharged home to follow up with his Cardiology for an outpatient stress test in the next 1-2 weeks.  GERD, continue with his PPI.  Obstructive sleep apnea, continue with his CPAP at night.  Hyperlipidemia, continue with simvastatin.  His lipid panel appears to be within normal  limits.  PHYSICAL EXAMINATION:  VITAL SIGNS:  The patient's vitals include temperature of 97.7, pulse was 72, blood pressure 123/67, saturating 97% on room air. GENERAL:  He is alert, afebrile, oriented x3, comfortable in no acute distress. CARDIOVASCULAR:  S1 and S2 heard. RESPIRATORY:  Chest clear to auscultation bilaterally.  No wheezing or rhonchi. ABDOMEN:  Soft, nontender, nondistended. EXTREMITIES:  No pedal edema present.  The patient at this time is hemodynamically stable for discharge.  He is recommended to follow up with his PCP in about a week and to also to follow up with Cardiology for an outpatient stress test.           ______________________________ Kathlen Mody, MD     [facility]  D:  01/07/2011  T:  01/08/2011  Job:  [id number]  Electronically Signed by Kathlen Mody MD on 01/10/2011 08:25:06 AM

## 2011-04-16 ENCOUNTER — Emergency Department (HOSPITAL_COMMUNITY): Payer: Self-pay

## 2011-04-16 ENCOUNTER — Emergency Department (HOSPITAL_COMMUNITY)
Admission: EM | Admit: 2011-04-16 | Discharge: 2011-04-16 | Disposition: A | Discharge status: Home / Self Care | Payer: Self-pay | Attending: Emergency Medicine | Admitting: Emergency Medicine

## 2011-04-16 DIAGNOSIS — R0989 Other specified symptoms and signs involving the circulatory and respiratory systems: Secondary | ICD-10-CM | POA: Insufficient documentation

## 2011-04-16 DIAGNOSIS — I509 Heart failure, unspecified: Secondary | ICD-10-CM | POA: Insufficient documentation

## 2011-04-16 DIAGNOSIS — R0609 Other forms of dyspnea: Secondary | ICD-10-CM | POA: Insufficient documentation

## 2011-04-16 DIAGNOSIS — M7989 Other specified soft tissue disorders: Secondary | ICD-10-CM | POA: Insufficient documentation

## 2011-04-16 DIAGNOSIS — R209 Unspecified disturbances of skin sensation: Secondary | ICD-10-CM | POA: Insufficient documentation

## 2011-04-16 DIAGNOSIS — R0602 Shortness of breath: Secondary | ICD-10-CM | POA: Insufficient documentation

## 2011-04-16 DIAGNOSIS — Z79899 Other long term (current) drug therapy: Secondary | ICD-10-CM | POA: Insufficient documentation

## 2011-04-16 DIAGNOSIS — R5381 Other malaise: Secondary | ICD-10-CM | POA: Insufficient documentation

## 2011-04-16 DIAGNOSIS — R079 Chest pain, unspecified: Secondary | ICD-10-CM | POA: Insufficient documentation

## 2011-04-16 DIAGNOSIS — R609 Edema, unspecified: Secondary | ICD-10-CM | POA: Insufficient documentation

## 2011-04-16 LAB — URINALYSIS, ROUTINE W REFLEX MICROSCOPIC
Ketones, ur: 15 mg/dL — AB
Nitrite: NEGATIVE
pH: 7 (ref 5.0–8.0)

## 2011-04-16 LAB — DIFFERENTIAL
Basophils Absolute: 0 10*3/uL (ref 0.0–0.1)
Basophils Relative: 0 % (ref 0–1)
Eosinophils Absolute: 0.3 10*3/uL (ref 0.0–0.7)
Eosinophils Relative: 3 % (ref 0–5)
Monocytes Absolute: 1 10*3/uL (ref 0.1–1.0)

## 2011-04-16 LAB — BASIC METABOLIC PANEL
CO2: 27 mEq/L (ref 19–32)
Calcium: 9.3 mg/dL (ref 8.4–10.5)
Creatinine, Ser: 0.92 mg/dL (ref 0.50–1.35)
GFR calc non Af Amer: >60 mL/min (ref >60)

## 2011-04-16 LAB — POCT I-STAT TROPONIN I: Troponin i, poc: 0 ng/mL (ref 0.00–0.08)

## 2011-04-16 LAB — CBC
HCT: 42.3 % (ref 39.0–52.0)
MCHC: 34.3 g/dL (ref 30.0–36.0)
Platelets: 238 10*3/uL (ref 150–400)
RDW: 14 % (ref 11.5–15.5)

## 2011-04-16 LAB — PRO B NATRIURETIC PEPTIDE: Pro B Natriuretic peptide (BNP): 16.2 pg/mL (ref 0–125)

## 2011-07-07 ENCOUNTER — Encounter: Payer: Self-pay | Admitting: *Deleted

## 2011-07-07 ENCOUNTER — Emergency Department (HOSPITAL_COMMUNITY): Payer: Self-pay

## 2011-07-07 ENCOUNTER — Emergency Department (HOSPITAL_COMMUNITY)
Admission: EM | Admit: 2011-07-07 | Discharge: 2011-07-07 | Disposition: A | Discharge status: Home / Self Care | Payer: Self-pay | Attending: Emergency Medicine | Admitting: Emergency Medicine

## 2011-07-07 DIAGNOSIS — R0602 Shortness of breath: Secondary | ICD-10-CM | POA: Insufficient documentation

## 2011-07-07 DIAGNOSIS — J209 Acute bronchitis, unspecified: Secondary | ICD-10-CM | POA: Insufficient documentation

## 2011-07-07 DIAGNOSIS — R05 Cough: Secondary | ICD-10-CM | POA: Insufficient documentation

## 2011-07-07 DIAGNOSIS — R071 Chest pain on breathing: Secondary | ICD-10-CM | POA: Insufficient documentation

## 2011-07-07 DIAGNOSIS — Z79899 Other long term (current) drug therapy: Secondary | ICD-10-CM | POA: Insufficient documentation

## 2011-07-07 DIAGNOSIS — R0789 Other chest pain: Secondary | ICD-10-CM

## 2011-07-07 DIAGNOSIS — R609 Edema, unspecified: Secondary | ICD-10-CM | POA: Insufficient documentation

## 2011-07-07 DIAGNOSIS — I509 Heart failure, unspecified: Secondary | ICD-10-CM | POA: Insufficient documentation

## 2011-07-07 DIAGNOSIS — R059 Cough, unspecified: Secondary | ICD-10-CM | POA: Insufficient documentation

## 2011-07-07 HISTORY — DX: Obesity, unspecified: E66.9

## 2011-07-07 HISTORY — DX: Heart failure, unspecified: I50.9

## 2011-07-07 LAB — URINALYSIS, ROUTINE W REFLEX MICROSCOPIC
Bilirubin Urine: NEGATIVE
Glucose, UA: NEGATIVE mg/dL
Ketones, ur: NEGATIVE mg/dL
Leukocytes, UA: NEGATIVE
Nitrite: NEGATIVE
Specific Gravity, Urine: 1.028 (ref 1.005–1.030)
pH: 6.5 (ref 5.0–8.0)

## 2011-07-07 LAB — COMPREHENSIVE METABOLIC PANEL
AST: 24 U/L (ref 0–37)
Albumin: 3.5 g/dL (ref 3.5–5.2)
BUN: 16 mg/dL (ref 6–23)
Calcium: 8.8 mg/dL (ref 8.4–10.5)
Creatinine, Ser: 0.74 mg/dL (ref 0.50–1.35)
Total Protein: 7.8 g/dL (ref 6.0–8.3)

## 2011-07-07 LAB — CBC
Hemoglobin: 14.6 g/dL (ref 13.0–17.0)
MCH: 28.3 pg (ref 26.0–34.0)
MCV: 83.7 fL (ref 78.0–100.0)
Platelets: 228 10*3/uL (ref 150–400)
RBC: 5.15 MIL/uL (ref 4.22–5.81)
WBC: 8.5 10*3/uL (ref 4.0–10.5)

## 2011-07-07 LAB — PRO B NATRIURETIC PEPTIDE: Pro B Natriuretic peptide (BNP): 9.9 pg/mL (ref 0–125)

## 2011-07-07 LAB — POCT I-STAT TROPONIN I

## 2011-07-07 MED ORDER — IBUPROFEN 600 MG PO TABS: 600.0000 mg | ORAL_TABLET | Freq: Four times a day (QID) | ORAL | Status: AC | PRN

## 2011-07-07 MED ORDER — AZITHROMYCIN 250 MG PO TABS: 250.0000 mg | ORAL_TABLET | Freq: Every day | ORAL | Status: AC

## 2011-07-07 NOTE — ED Provider Notes (Signed)
5:12 PM I've seen and evaluated the patient appears to be morbidly obese with mild lower extremity edema bilaterally, no erythema of the lower extremities, and he is in no apparent respiratory distress and has clear lung sounds to auscultation in all fields with normal heart rate and rhythm and no audible murmurs.  I performed a history and physical examination of Richard Benson and discussed his management with Dr. Zebedee Iba.  I agree with the history, physical, assessment, and plan of care, with the following exceptions: None  I was present for the following procedures: None Time Spent in Critical Care of the patient: None Time spent in discussions with the patient and family: 5 minutes  Christabell Loseke,[name]    Felisa Bonier, MD 07/07/11 [phone]

## 2011-07-07 NOTE — ED Notes (Signed)
Pt began having CP yesterday.  CP increased today and became "really bad" 30 minutes ago.  Pt has SOB and nausea with this, he is also diaphoretic.  Pt also had bilateral LE edema and states that this has increased and he has also been having some coughing associated with CHF

## 2011-07-07 NOTE — ED Provider Notes (Signed)
History     CSN: [id number] Arrival date & time: 07/07/2011  3:59 PM   First MD Initiated Contact with Patient 07/07/11 1606      Chief Complaint  Patient presents with  . Chest Pain    (Consider location/radiation/quality/duration/timing/severity/associated sxs/prior treatment) The history is provided by the patient.   patient is a 35 year old male with a history of morbid obesity, sleep apnea, chronic lower extremity edema who presents with chest pain. He said this chest pain numerous times in the past but has not had it since August. Her last month he has intermittently had it. Over the last 2 days she has had worsening chest pain. This chest pain is located on the lower left part of his chest. Most recent episode started while sitting in bed.  These episodes typically last seconds to minutes. The most recent episode has lasted about 30 minutes.  CP is mostly gone while in ED.  It is not brought on by exertion and not changed with exertion. He denies any new shortness of breath but states he always has mild shortness of breath because of his size. He has had mild dry nonproductive cough for about one month but has not had any fever or productive sputum. Reports negative chest x-ray PCP. Patient denies associated nausea or presyncope or syncope. He's been tolerating by mouth without any trouble and denies missing his medications. No recent changes to his Lasix. No recent travel or sick contacts and no pain elsewhere in his body.  Overall severity is described as moderate.  Past Medical History  Diagnosis Date  . CHF (congestive heart failure)   . Obesity     Past Surgical History  Procedure Date  . Eye surgery     History reviewed. No pertinent family history.  History  Substance Use Topics  . Smoking status: Not on file  . Smokeless tobacco: Not on file  . Alcohol Use: No      Review of Systems  Constitutional: Negative for fever, chills and activity change.  HENT:  Negative for congestion and neck pain.   Respiratory: Negative for chest tightness and wheezing.   Gastrointestinal: Negative for nausea, vomiting, abdominal pain, diarrhea and abdominal distention.  Genitourinary: Negative for difficulty urinating.  Musculoskeletal: Negative for gait problem.  Skin: Negative for rash.  Neurological: Negative for weakness and numbness.  Psychiatric/Behavioral: Negative for behavioral problems and confusion.  All other systems reviewed and are negative.    Allergies  Review of patient's allergies indicates no known allergies.  Home Medications   Current Outpatient Rx  Name Route Sig Dispense Refill  . FUROSEMIDE 40 MG PO TABS Oral Take 80 mg by mouth 2 (two) times daily.      Marland Kitchen HYDROCODONE-ACETAMINOPHEN 7.5-325 MG PO TABS Oral Take 1 tablet by mouth every 12 (twelve) hours.      Marland Kitchen SIMVASTATIN 10 MG PO TABS Oral Take 10 mg by mouth at bedtime.      . SPIRONOLACTONE 25 MG PO TABS Oral Take 25 mg by mouth daily.      . AZITHROMYCIN 250 MG PO TABS Oral Take 1 tablet (250 mg total) by mouth daily. Take first 2 tablets together, then 1 every day until finished. 6 tablet 0  . IBUPROFEN 600 MG PO TABS Oral Take 1 tablet (600 mg total) by mouth every 6 (six) hours as needed for pain. 30 tablet 0    BP 127/68  Pulse 76  Temp(Src) 97.7 F (36.5 C) (Oral)  Resp  21  SpO2 98%  Physical Exam  Nursing note and vitals reviewed. Constitutional: He is oriented to person, place, and time. He appears well-developed and well-nourished. No distress.       Morbidly obese  HENT:  Head: Normocephalic.  Nose: Nose normal.  Eyes: EOM are normal. Pupils are equal, round, and reactive to light.  Neck: Normal range of motion. Neck supple. No JVD present.  Cardiovascular: Normal rate, regular rhythm and intact distal pulses.   No murmur heard. Pulmonary/Chest: Effort normal and breath sounds normal. No respiratory distress. He has no wheezes. He exhibits no tenderness.    Abdominal: Soft. Bowel sounds are normal. He exhibits no distension. There is no tenderness.       obese  Musculoskeletal: Normal range of motion. He exhibits edema (moderate, equal BLE). He exhibits no tenderness.       No calf ttp  Neurological: He is alert and oriented to person, place, and time.       Normal strength  Skin: Skin is warm and dry. He is not diaphoretic.  Psychiatric: He has a normal mood and affect. His behavior is normal. Thought content normal.    ED Course  Procedures (including critical care time)  Date: 07/07/2011  Rate: 80  Rhythm: normal sinus rhythm  QRS Axis: normal  Intervals: normal  ST/T Wave abnormalities: normal  Conduction Disutrbances:none  Narrative Interpretation: normal ekg with poor quality in V2  Old EKG Reviewed: unchanged   Labs Reviewed  COMPREHENSIVE METABOLIC PANEL - Abnormal; Notable for the following:    Glucose, Bld 125 (*)    All other components within normal limits  CBC  URINALYSIS, ROUTINE W REFLEX MICROSCOPIC  POCT I-STAT TROPONIN I  PRO B NATRIURETIC PEPTIDE   Dg Chest 2 View  07/07/2011  *RADIOLOGY REPORT*  Clinical Data: Chest pain extending into the arms and neck, shortness of breath  CHEST - 2 VIEW  Comparison: Chest x-ray of 04/16/2011  Findings: The lungs are clear.  Mild peribronchial thickening is present.  Mediastinal contours appear normal.  The heart is within normal limits in size.  No bony abnormality is seen.  IMPRESSION: No pneumonia.  Mild peribronchial thickening.  Original Report Authenticated By: Juline Patch, M.D.     1. Acute bronchitis   2. Chest wall pain       MDM  Patient here with atypical chest pain. Based on the history of recent dry cough fever chest wall tenderness may be related to bronchitis. Patient does note lower extremity swelling however B. natruretic peptide is normal. Patient also is normal a chest x-ray and EKG. He has had persistent pain throughout the day yet spot troponin  is negative. Patient has PCP followup. Return precautions discussed.        Milus Glazier 07/08/11 [phone]

## 2011-07-12 NOTE — ED Provider Notes (Signed)
Evaluation and management procedures were performed by the Resident Physician under my supervision/collaboration.  I evaluated this patient face-to-face at the time of encounter.  Please see my note dated at that time.  Felisa Bonier, MD 07/12/11 [phone]

## 2011-08-07 ENCOUNTER — Emergency Department (HOSPITAL_COMMUNITY): Payer: Medicaid Other

## 2011-08-07 ENCOUNTER — Other Ambulatory Visit: Payer: Self-pay

## 2011-08-07 ENCOUNTER — Encounter (HOSPITAL_COMMUNITY): Payer: Self-pay | Admitting: Emergency Medicine

## 2011-08-07 ENCOUNTER — Emergency Department (HOSPITAL_COMMUNITY)
Admission: EM | Admit: 2011-08-07 | Discharge: 2011-08-07 | Disposition: A | Discharge status: Home / Self Care | Payer: Medicaid Other | Attending: Emergency Medicine | Admitting: Emergency Medicine

## 2011-08-07 DIAGNOSIS — R0602 Shortness of breath: Secondary | ICD-10-CM | POA: Insufficient documentation

## 2011-08-07 DIAGNOSIS — R079 Chest pain, unspecified: Secondary | ICD-10-CM | POA: Insufficient documentation

## 2011-08-07 DIAGNOSIS — R609 Edema, unspecified: Secondary | ICD-10-CM | POA: Insufficient documentation

## 2011-08-07 DIAGNOSIS — E669 Obesity, unspecified: Secondary | ICD-10-CM | POA: Insufficient documentation

## 2011-08-07 DIAGNOSIS — I509 Heart failure, unspecified: Secondary | ICD-10-CM | POA: Insufficient documentation

## 2011-08-07 LAB — PRO B NATRIURETIC PEPTIDE: Pro B Natriuretic peptide (BNP): 5.6 pg/mL (ref 0–125)

## 2011-08-07 LAB — BASIC METABOLIC PANEL WITH GFR
BUN: 15 mg/dL (ref 6–23)
CO2: 27 meq/L (ref 19–32)
Calcium: 8.8 mg/dL (ref 8.4–10.5)
Chloride: 103 meq/L (ref 96–112)
Creatinine, Ser: 0.88 mg/dL (ref 0.50–1.35)
GFR calc Af Amer: >90 mL/min
GFR calc non Af Amer: >90 mL/min
Glucose, Bld: 118 mg/dL — ABNORMAL HIGH (ref 70–99)
Potassium: 3.3 meq/L — ABNORMAL LOW (ref 3.5–5.1)
Sodium: 138 meq/L (ref 135–145)

## 2011-08-07 LAB — DIFFERENTIAL
Basophils Absolute: 0 K/uL (ref 0.0–0.1)
Basophils Relative: 1 % (ref 0–1)
Eosinophils Absolute: 0.2 K/uL (ref 0.0–0.7)
Eosinophils Relative: 3 % (ref 0–5)
Lymphocytes Relative: 34 % (ref 12–46)
Lymphs Abs: 3 K/uL (ref 0.7–4.0)
Monocytes Absolute: 0.7 K/uL (ref 0.1–1.0)
Monocytes Relative: 8 % (ref 3–12)
Neutro Abs: 4.8 K/uL (ref 1.7–7.7)
Neutrophils Relative %: 55 % (ref 43–77)

## 2011-08-07 LAB — CBC
HCT: 41.1 % (ref 39.0–52.0)
Hemoglobin: 13.9 g/dL (ref 13.0–17.0)
MCHC: 33.8 g/dL (ref 30.0–36.0)
RDW: 14.1 % (ref 11.5–15.5)
WBC: 8.7 10*3/uL (ref 4.0–10.5)

## 2011-08-07 LAB — POCT I-STAT TROPONIN I: Troponin i, poc: 0 ng/mL (ref 0.00–0.08)

## 2011-08-07 MED ORDER — POTASSIUM CHLORIDE 20 MEQ/15ML (10%) PO LIQD: 20.0000 meq | Freq: Once | ORAL | Status: DC

## 2011-08-07 MED ORDER — POTASSIUM CHLORIDE CRYS ER 20 MEQ PO TBCR
EXTENDED_RELEASE_TABLET | ORAL | Status: AC
  Administered 2011-08-07: 20 meq
  Filled 2011-08-07: qty 1

## 2011-08-07 NOTE — ED Notes (Signed)
Vital signs stable. 

## 2011-08-07 NOTE — ED Provider Notes (Signed)
History    this is a morbidly obese 35 year old gentleman with a history of CHF who presents to the ED with a chief complaints of chest pain and shortness of breath. Patient states he has been noticing increase lower extremity edema and shortness of breath for the past several months. He is having trouble laying down and sleeping due to difficulty breathing. He is not getting adequate sleep, and having trouble thinking. He has been taking his medication as prescribed but has been notice that his medicine is not adequately effective. Today, while driving he notice increased chest discomfort and shortness of breath. Chest pain is decribed as pressure. He has had a stress test in the past 2 years which is negative. He did receive aspirin and nitroglycerin prior to arrival which has helped his chest discomfort. He denies fever, productive cough, nausea, vomiting, abdominal pain, dysuria.  He denies smoking history.He would like to be admitted to the hospital for further management.   CSN: [id number]  Arrival date & time 08/07/11  2000   First MD Initiated Contact with Patient 08/07/11 2023      Chief Complaint  Patient presents with  . Chest Pain    (Consider location/radiation/quality/duration/timing/severity/associated sxs/prior treatment) HPI  Past Medical History  Diagnosis Date  . CHF (congestive heart failure)   . Obesity     Past Surgical History  Procedure Date  . Eye surgery     History reviewed. No pertinent family history.  History  Substance Use Topics  . Smoking status: Not on file  . Smokeless tobacco: Not on file  . Alcohol Use: No      Review of Systems  All other systems reviewed and are negative.    Allergies  Review of patient's allergies indicates no known allergies.  Home Medications   Current Outpatient Rx  Name Route Sig Dispense Refill  . ASPIRIN EC 81 MG PO TBEC Oral Take 81 mg by mouth daily.      . FUROSEMIDE 40 MG PO TABS Oral Take 80 mg by  mouth 2 (two) times daily.      Marland Kitchen HYDROCODONE-ACETAMINOPHEN 7.5-325 MG PO TABS Oral Take 1 tablet by mouth every 12 (twelve) hours.     Marland Kitchen NITROGLYCERIN 0.4 MG SL SUBL Sublingual Place 0.4 mg under the tongue every 5 (five) minutes as needed. For chest pain     . SIMVASTATIN 10 MG PO TABS Oral Take 10 mg by mouth at bedtime.      . SPIRONOLACTONE 25 MG PO TABS Oral Take 25 mg by mouth daily.       BP 138/58  Pulse 82  Temp(Src) 97.6 F (36.4 C) (Oral)  Resp 10  SpO2 100%  Physical Exam  Nursing note and vitals reviewed. Constitutional: He appears well-developed and well-nourished.       Awake, alert, nontoxic appearance.  Morbidly obese  HENT:  Head: Atraumatic.  Eyes: Right eye exhibits no discharge. Left eye exhibits no discharge.  Neck: Neck supple.  Cardiovascular: Normal rate, regular rhythm and intact distal pulses.        2+ pedal edema noted bilaterally  Pulmonary/Chest: Effort normal. No respiratory distress. He has no wheezes. He has no rales. He exhibits no tenderness.  Abdominal: There is no tenderness. There is no rebound.  Musculoskeletal: He exhibits no tenderness.       Baseline ROM, no obvious new focal weakness  Neurological: He is alert.       Mental status and motor strength appears baseline  for patient and situation  Skin: No rash noted.  Psychiatric: He has a normal mood and affect.    ED Course  Procedures (including critical care time)  Labs Reviewed - No data to display No results found.   No diagnosis found.    MDM  Morbidly obese patient with worsening shortness of breath. His EKG is normal, his vital signs stable he is satting at 100% on 2 L of oxygen. He has 2+ pedal edema bilaterally. His lung is clear on exam. I'll check a BNP and chest x-ray along with appropriate labs for further evaluation.   9:49 PM Pt has normal ECG, BNP, Troponin, and essentually normal electrolytes and H&H.  His CXR shows no evidence of active disease.  I  anticipate that his large body habitus is likely the culprit for his condition.  I suggest continue weight loss and f/u with PCP for further management.  Pt has negative stress test in the past 2 years, doubt cardiopulmonary etiology.    Medical screening examination/treatment/procedure(s) were performed by non-physician practitioner and as supervising physician I was immediately available for consultation/collaboration. Osvaldo Human, M.D.       Fayrene Helper, PA 08/07/11 2236  Carleene Cooper III, MD 08/08/11 [phone]

## 2011-08-07 NOTE — ED Notes (Signed)
Patient with chest pain that started while driving.  Patient started with pressure that 9/10, patient was given 324mg  of ASA and 2 nitro SL with pain now 1/10.   Patient is having shortness of breath and nausea with chest pressure.  Patient having pain in left chest radiating down left arm.

## 2011-08-07 NOTE — ED Notes (Signed)
Patient is resting comfortably.  He rates his pain as a 2/10.

## 2011-08-07 NOTE — ED Provider Notes (Signed)
8:21 PM  Date: 08/07/2011  Rate:82  Rhythm: normal sinus rhythm  QRS Axis: normal  Intervals: normal  ST/T Wave abnormalities: normal  Conduction Disutrbances:none  Narrative Interpretation: Normal EKG  Old EKG Reviewed: unchanged from tracing of 07/07/2011.      Carleene Cooper III, MD 08/07/11 2022

## 2011-08-07 NOTE — ED Notes (Signed)
EKG RUN BY EMT R Lupie Sawa OLD AND NEW EKG GIVEN TO DR Ignacia Palma

## 2011-10-31 IMAGING — CT CT HEAD W/O CM
1 series · 16 of 30 positions shown, 20 images · non-contrast
Comparison: Head CT scan 02/28/2009.

CLINICAL DATA: Syncope.

CT HEAD WITHOUT CONTRAST
TECHNIQUE: Contiguous axial images were obtained from the base of
the skull through the vertex without contrast.

[Series 2: head routine 4.8 h37s · axial · 0.48mm/px · z∈[+106,+261]mm · 16 of 36 slices shown, 20 images]
[im 2/36  brain]
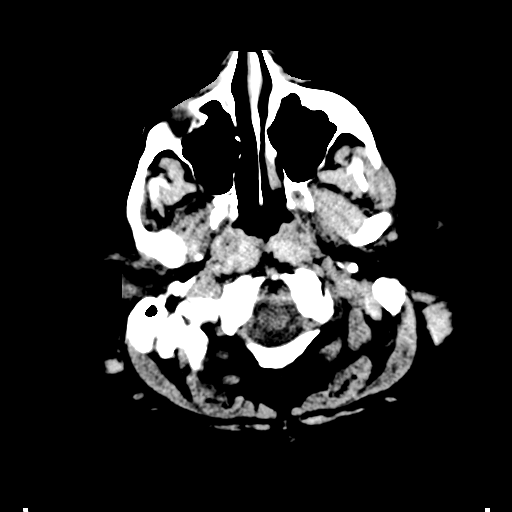
[im 2/36  bone]
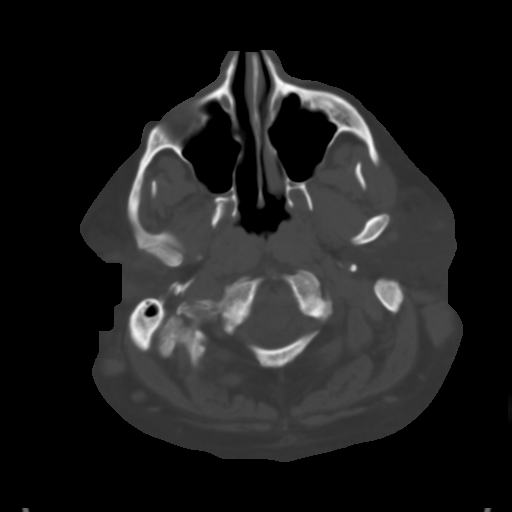
[im 4/36  brain]
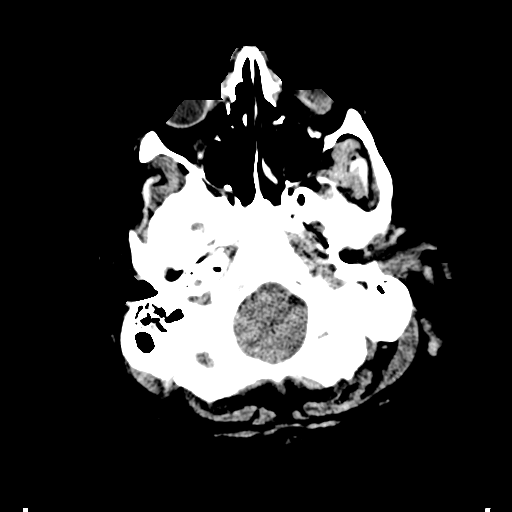
[im 7/36  brain]
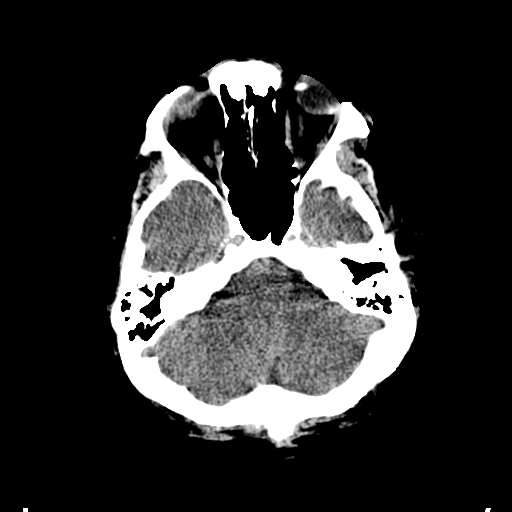
[im 9/36  brain]
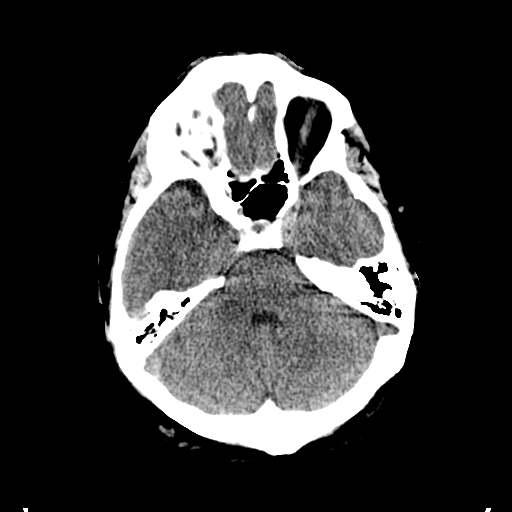
[im 10/36  brain]
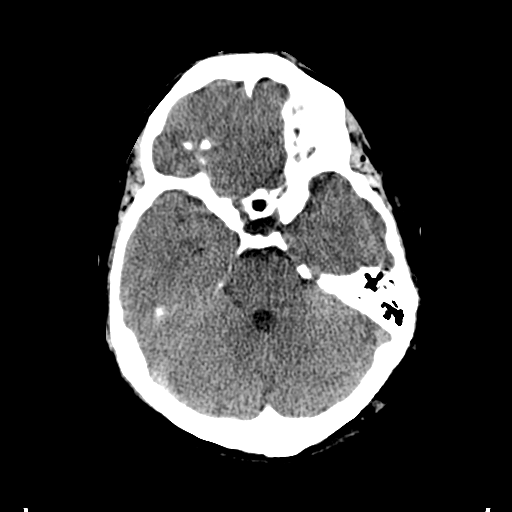
[im 10/36  bone]
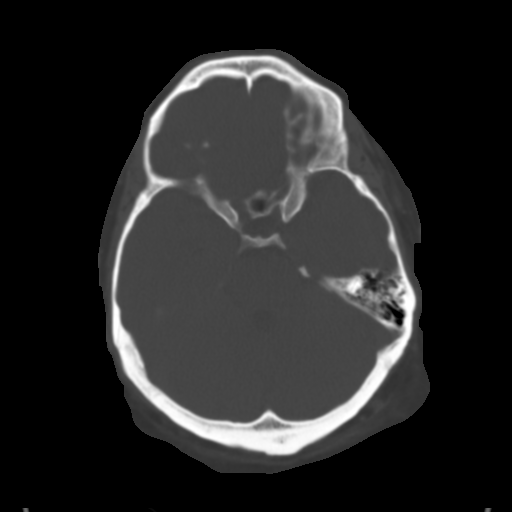
[im 13/36  brain]
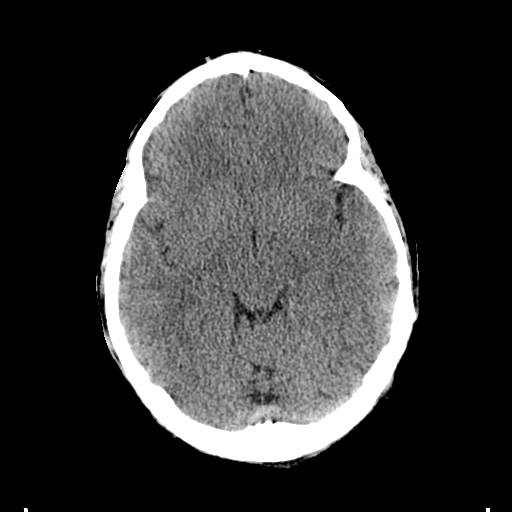
[im 15/36  brain]
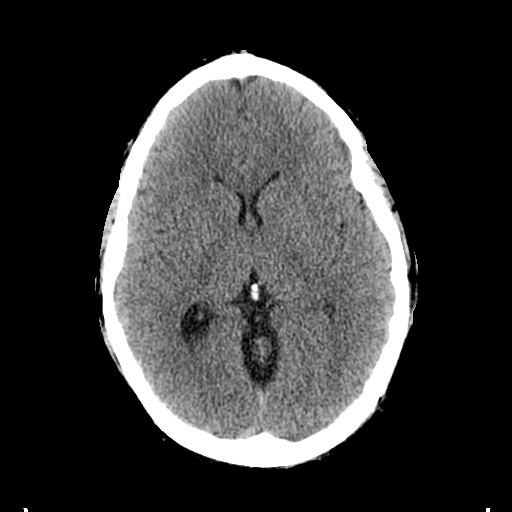
[im 17/36  brain]
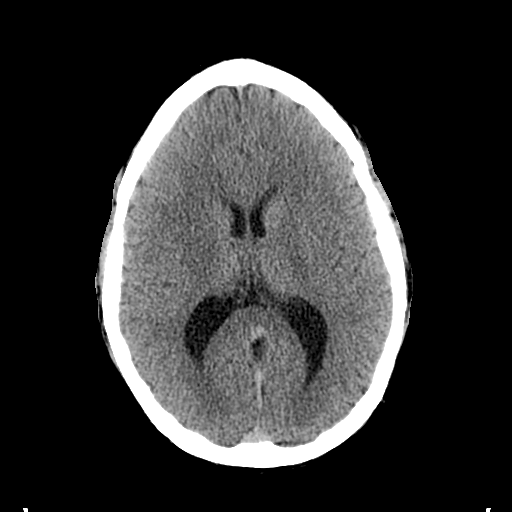
[im 19/36  brain]
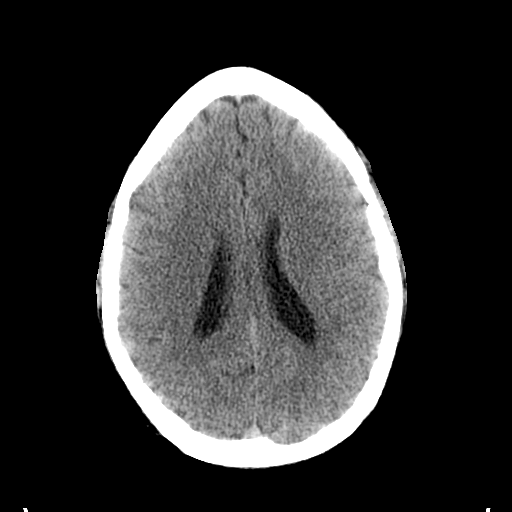
[im 19/36  bone]
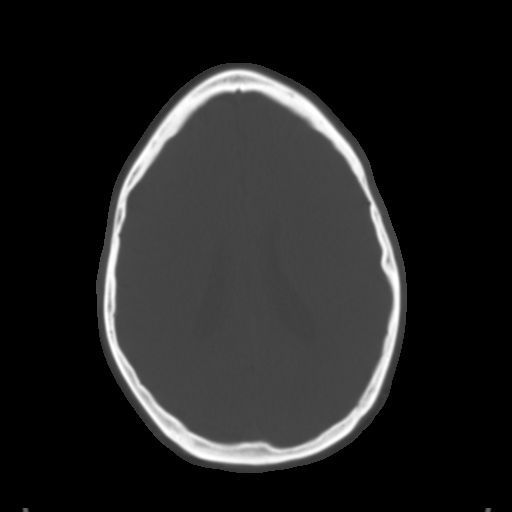
[im 21/36  brain]
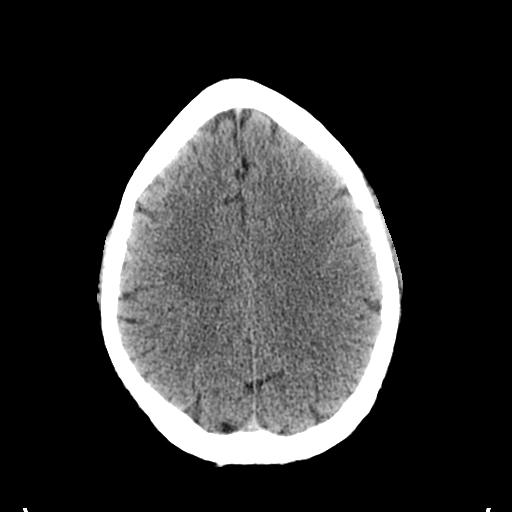
[im 23/36  brain]
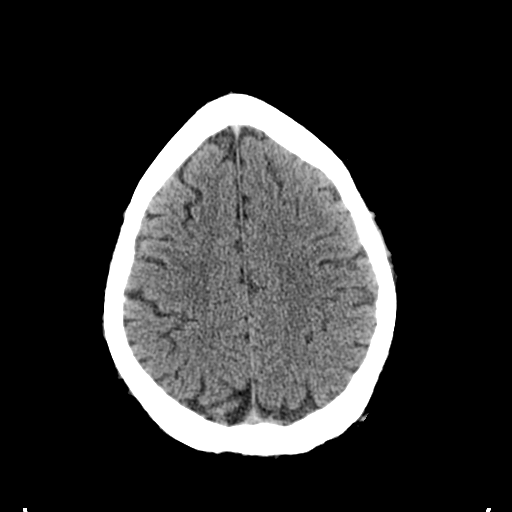
[im 26/36  brain]
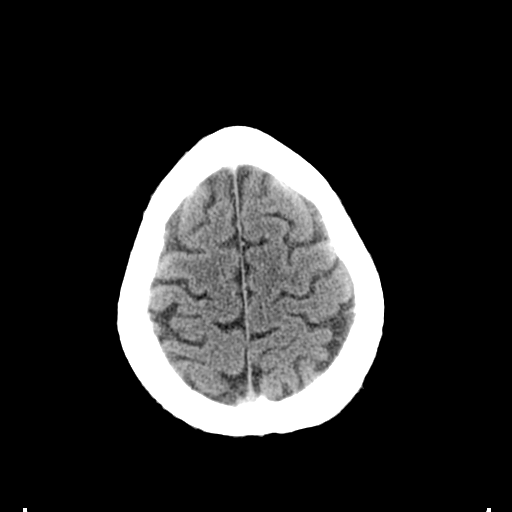
[im 27/36  brain]
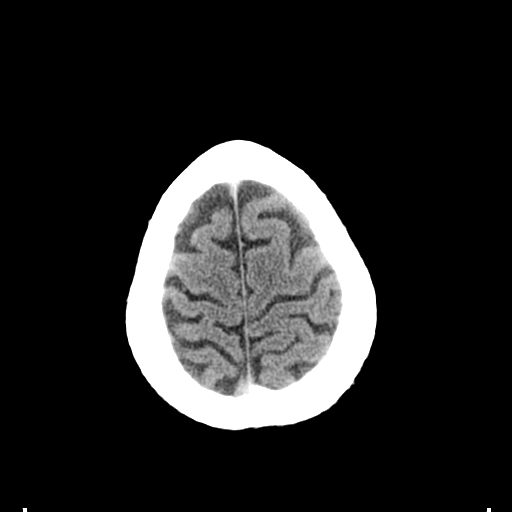
[im 27/36  bone]
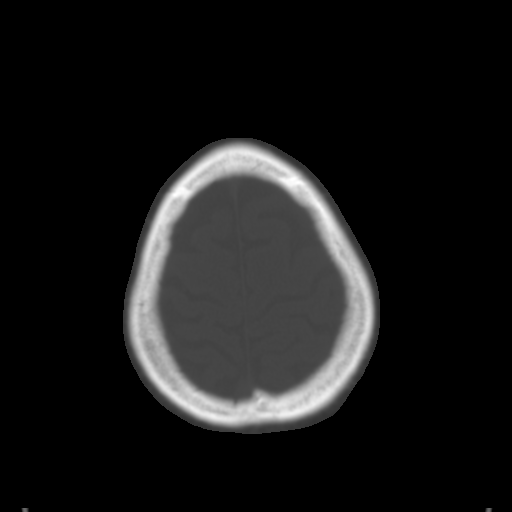
[im 29/36  brain]
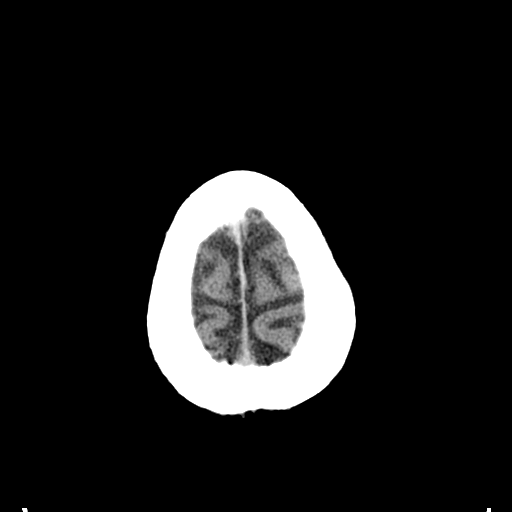
[im 32/36  brain]
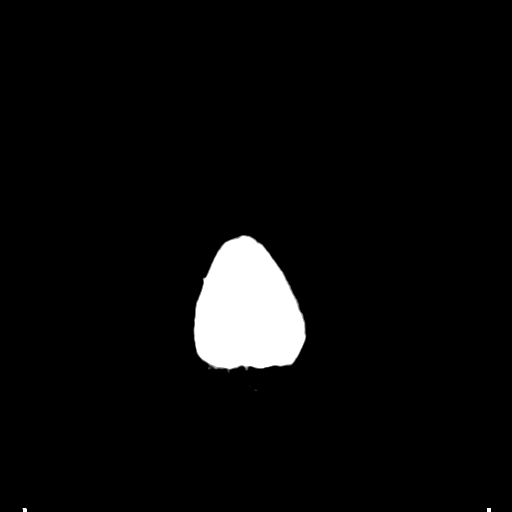
[im 34/36  brain]
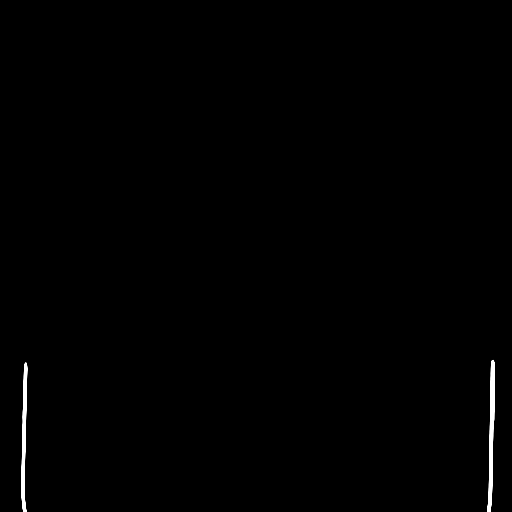

[16 of 30 positions shown; findings below may reference images not displayed]

FINDINGS: The brain appears normal without evidence of acute
infarction, hemorrhage, mass lesion, mass effect, midline shift or
abnormal extra-axial fluid collection.  No hydrocephalus.
Calvarium intact.  Imaged paranasal sinuses and mastoid air cells
are clear.
IMPRESSION: Negative exam.

## 2011-10-31 IMAGING — CT CT ANGIO CHEST
2 of 6 series · 19 of 36 positions shown · IV contrast (APPLIED)
Comparison: Plain film chest 11/28/2009.

CLINICAL DATA: Shortness of breath and chest pain.

CT ANGIOGRAPHY CHEST WITH CONTRAST
TECHNIQUE: Multidetector CT imaging of the chest was performed
using the standard protocol during bolus administration of
intravenous contrast.  Multiplanar CT image reconstructions
including MIPs were obtained to evaluate the vascular anatomy.
Contrast:  100 ml Omnipaque 350.

[Series 9: pulm embolism 1.0 b25f thins · axial · 0.82mm/px · z∈[-240,-24]mm · 18 of 240 slices shown]
[im 12/240  lung]
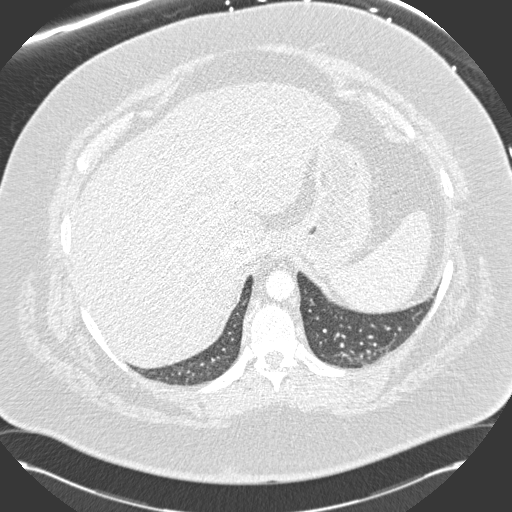
[im 24/240  mediastinal]
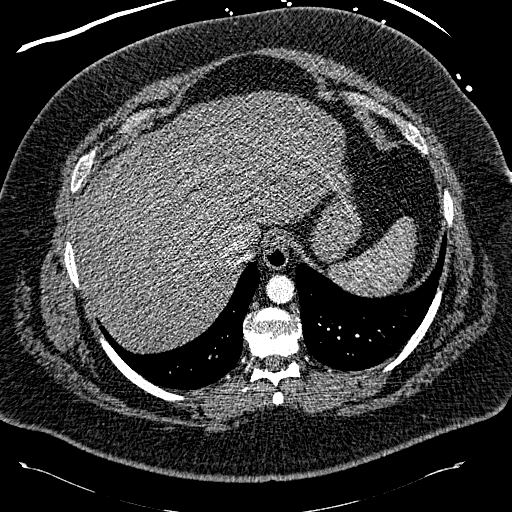
[im 36/240  lung]
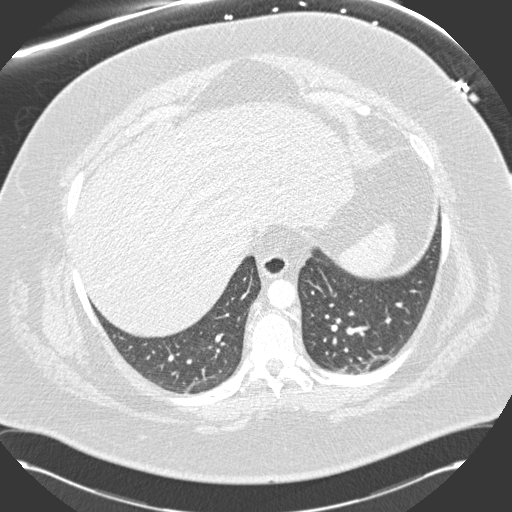
[im 48/240  mediastinal]
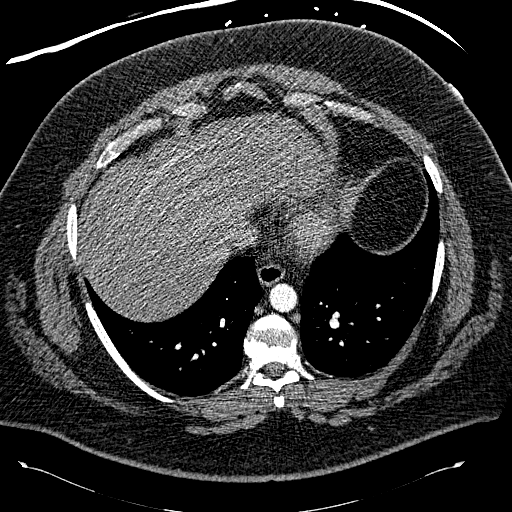
[im 60/240  lung]
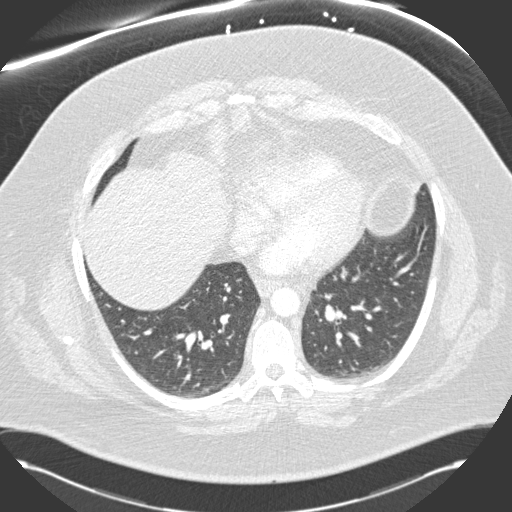
[im 72/240  mediastinal]
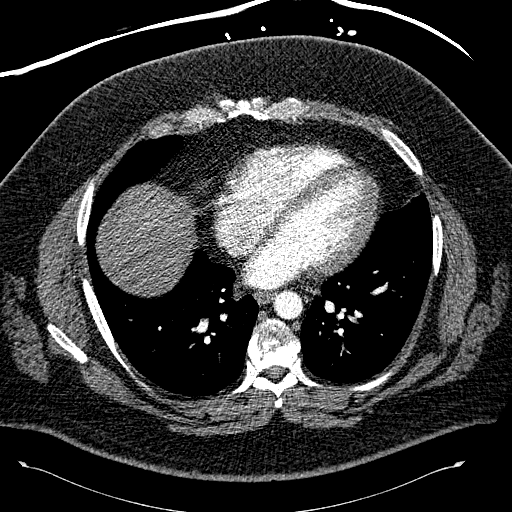
[im 84/240  lung]
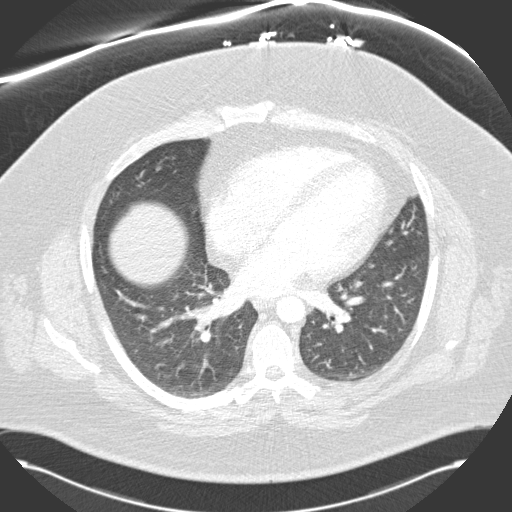
[im 96/240  mediastinal]
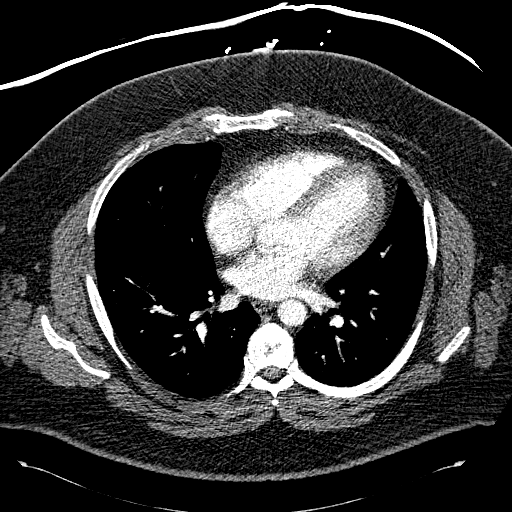
[im 108/240  lung]
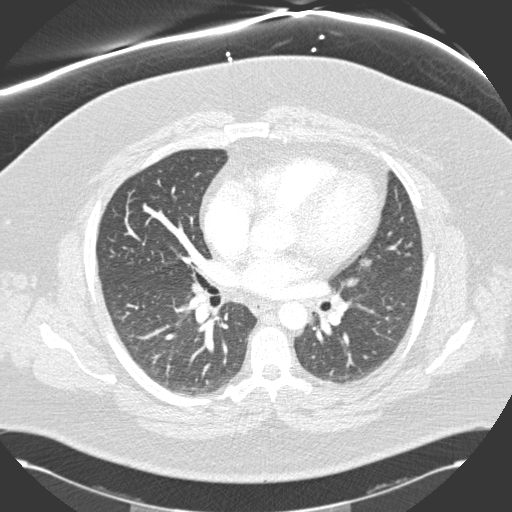
[im 132/240  mediastinal]
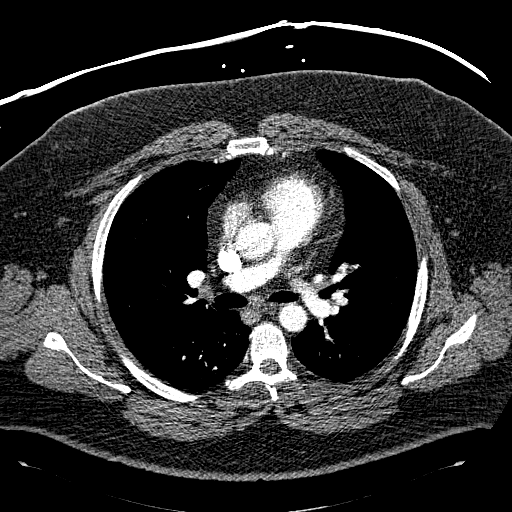
[im 144/240  lung]
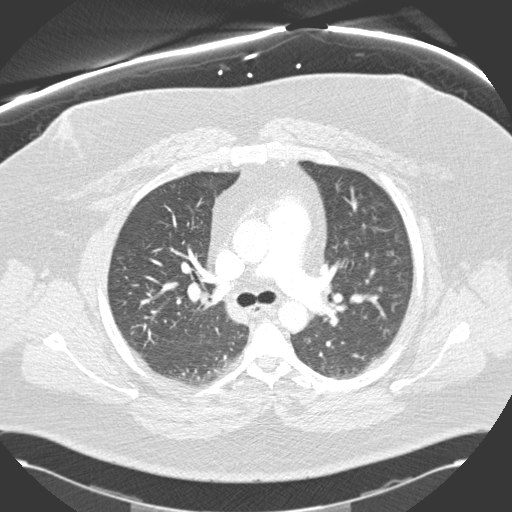
[im 156/240  mediastinal]
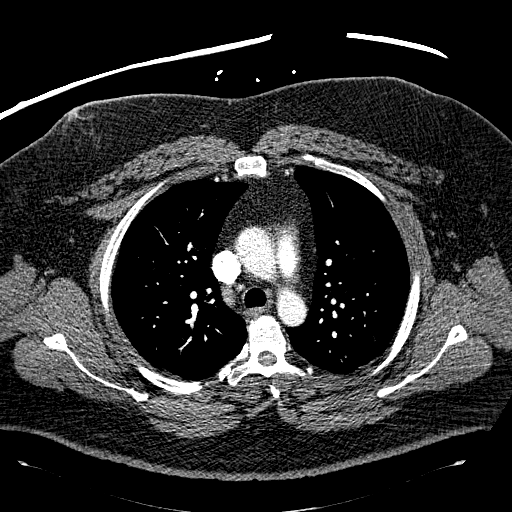
[im 168/240  lung]
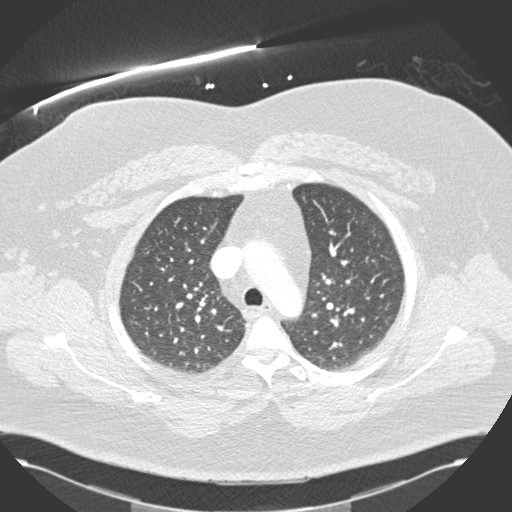
[im 180/240  mediastinal]
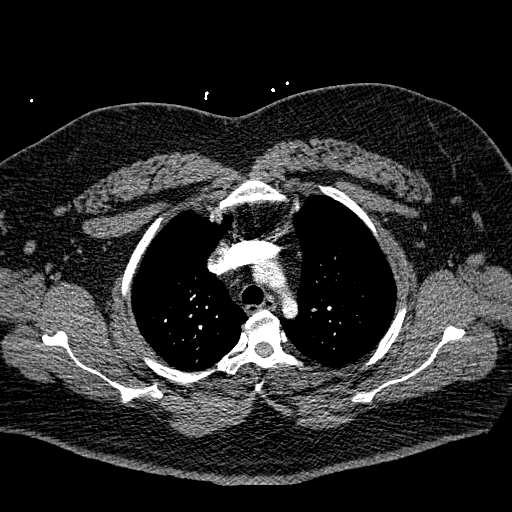
[im 192/240  lung]
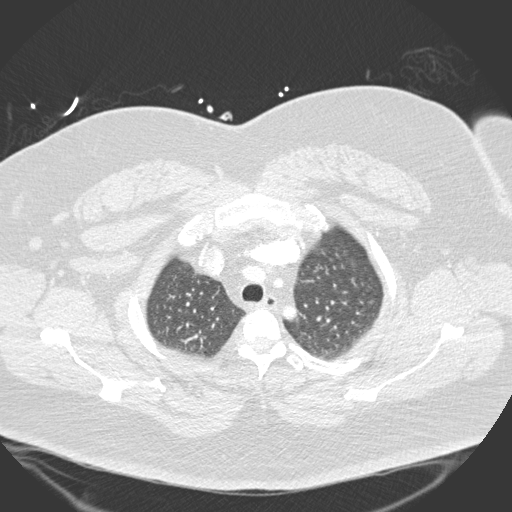
[im 204/240  mediastinal]
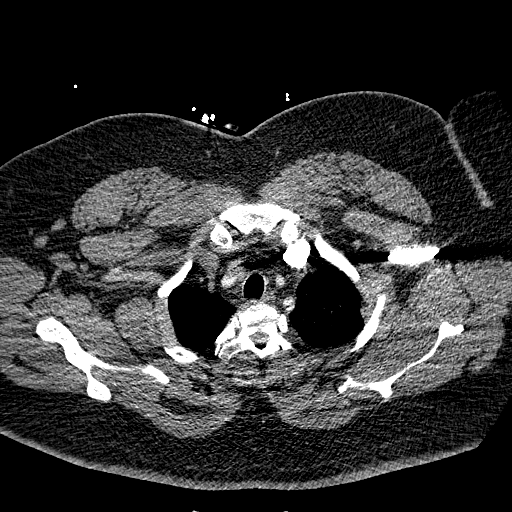
[im 216/240  lung]
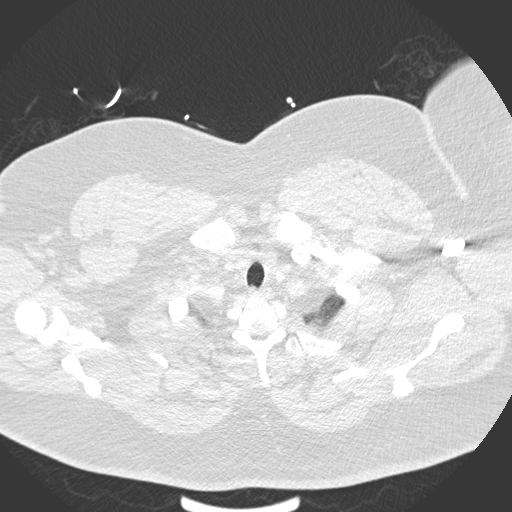
[im 228/240  mediastinal]
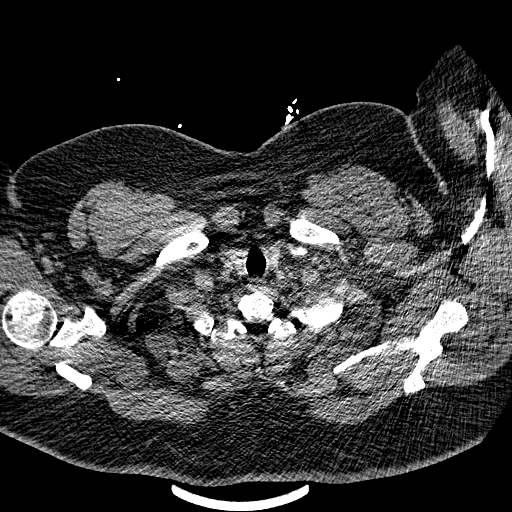

[Series 602: coronal · coronal · 0.82mm/px · 1 of 136 slices shown]
[im 68/136  mediastinal]
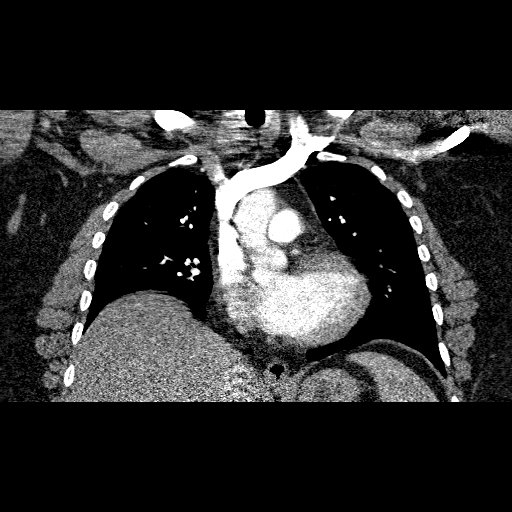

[19 of 36 positions shown; findings below may reference images not displayed]

FINDINGS: The study is technically good.  No pulmonary embolus is
identified.  No pleural or pericardial effusion.  No axillary,
hilar mediastinal lymphadenopathy.  The lungs are clear.
Incidentally imaged upper abdomen is unremarkable.  There is no
focal bony abnormality.

Review of the MIP images confirms the above findings.
IMPRESSION: Negative for pulmonary embolus.  Negative exam.

## 2011-12-03 ENCOUNTER — Encounter: Payer: Self-pay | Admitting: *Deleted

## 2017-08-29 ENCOUNTER — Telehealth: Payer: Self-pay | Admitting: Cardiology

## 2017-08-29 MED ORDER — ZOLPIDEM TARTRATE 5 MG PO TABS
5.00 | ORAL_TABLET | ORAL | Status: DC
Start: ? — End: 2017-08-29

## 2017-08-29 MED ORDER — MULTI-VITAMINS PO TABS
1.00 | ORAL_TABLET | ORAL | Status: DC
Start: 2017-08-29 — End: 2017-08-29

## 2017-08-29 MED ORDER — ONDANSETRON HCL 4 MG/2ML IJ SOLN
4.00 | INTRAMUSCULAR | Status: DC
Start: ? — End: 2017-08-29

## 2017-08-29 MED ORDER — SODIUM CHLORIDE 0.9 % IJ SOLN
10.00 | INTRAMUSCULAR | Status: DC
Start: 2017-08-28 — End: 2017-08-29

## 2017-08-29 MED ORDER — BENZONATATE 100 MG PO CAPS
100.00 | ORAL_CAPSULE | ORAL | Status: DC
Start: ? — End: 2017-08-29

## 2017-08-29 MED ORDER — DIPHENHYDRAMINE HCL 25 MG PO CAPS
25.00 | ORAL_CAPSULE | ORAL | Status: DC
Start: ? — End: 2017-08-29

## 2017-08-29 MED ORDER — HYDRALAZINE HCL 25 MG PO TABS
25.00 | ORAL_TABLET | ORAL | Status: DC
Start: ? — End: 2017-08-29

## 2017-08-29 MED ORDER — HYDRALAZINE HCL 20 MG/ML IJ SOLN
10.00 | INTRAMUSCULAR | Status: DC
Start: ? — End: 2017-08-29

## 2017-08-29 MED ORDER — SODIUM CHLORIDE 0.9 % IJ SOLN
10.00 | INTRAMUSCULAR | Status: DC
Start: ? — End: 2017-08-29

## 2017-08-29 MED ORDER — DOCUSATE SODIUM 100 MG PO CAPS
100.00 | ORAL_CAPSULE | ORAL | Status: DC
Start: ? — End: 2017-08-29

## 2017-08-29 MED ORDER — ACETAMINOPHEN 325 MG PO TABS
650.00 | ORAL_TABLET | ORAL | Status: DC
Start: ? — End: 2017-08-29

## 2017-08-29 MED ORDER — ONDANSETRON 4 MG PO TBDP
4.00 | ORAL_TABLET | ORAL | Status: DC
Start: ? — End: 2017-08-29

## 2017-08-29 MED ORDER — RIVAROXABAN 20 MG PO TABS
20.00 | ORAL_TABLET | ORAL | Status: DC
Start: 2017-08-29 — End: 2017-08-29

## 2017-08-29 MED ORDER — BISACODYL 5 MG PO TBEC
10.00 | DELAYED_RELEASE_TABLET | ORAL | Status: DC
Start: ? — End: 2017-08-29

## 2017-08-29 MED ORDER — VITAMIN B-12 1000 MCG PO TABS
1000.00 | ORAL_TABLET | ORAL | Status: DC
Start: 2017-08-29 — End: 2017-08-29

## 2017-08-29 NOTE — Telephone Encounter (Signed)
Pt states he's been a pt of [facility] for the past 10 years (although he has not been seen here)and has a pacemaker. He was electrocuted in December and has been having bad migraines since and feels like he's having a stroke at times.  Recently went to ER and left AMA because he felt like "they weren't helping him". Also has called PCP and hasn't heard back.   [facility] so it will not even give me the option to schedule an appt...Marland Kitchen..Marland Kitchen

## 2017-08-29 NOTE — Telephone Encounter (Signed)
Left message to return call 

## 2017-09-03 ENCOUNTER — Emergency Department (HOSPITAL_COMMUNITY): Payer: Medicaid Other

## 2017-09-03 ENCOUNTER — Inpatient Hospital Stay (HOSPITAL_COMMUNITY)
Admission: EM | Admit: 2017-09-03 | Discharge: 2017-09-05 | DRG: 084 | Disposition: A | Discharge status: Home / Self Care | Payer: Medicaid Other | Attending: General Surgery | Admitting: General Surgery

## 2017-09-03 ENCOUNTER — Other Ambulatory Visit: Payer: Self-pay

## 2017-09-03 ENCOUNTER — Encounter (HOSPITAL_COMMUNITY): Payer: Self-pay

## 2017-09-03 DIAGNOSIS — H5462 Unqualified visual loss, left eye, normal vision right eye: Secondary | ICD-10-CM | POA: Diagnosis not present

## 2017-09-03 DIAGNOSIS — Z8673 Personal history of transient ischemic attack (TIA), and cerebral infarction without residual deficits: Secondary | ICD-10-CM

## 2017-09-03 DIAGNOSIS — Z23 Encounter for immunization: Secondary | ICD-10-CM | POA: Diagnosis not present

## 2017-09-03 DIAGNOSIS — E785 Hyperlipidemia, unspecified: Secondary | ICD-10-CM | POA: Diagnosis not present

## 2017-09-03 DIAGNOSIS — Z9884 Bariatric surgery status: Secondary | ICD-10-CM

## 2017-09-03 DIAGNOSIS — Z79891 Long term (current) use of opiate analgesic: Secondary | ICD-10-CM | POA: Diagnosis not present

## 2017-09-03 DIAGNOSIS — I4891 Unspecified atrial fibrillation: Secondary | ICD-10-CM | POA: Diagnosis present

## 2017-09-03 DIAGNOSIS — E669 Obesity, unspecified: Secondary | ICD-10-CM | POA: Diagnosis present

## 2017-09-03 DIAGNOSIS — Z950 Presence of cardiac pacemaker: Secondary | ICD-10-CM

## 2017-09-03 DIAGNOSIS — Y92410 Unspecified street and highway as the place of occurrence of the external cause: Secondary | ICD-10-CM | POA: Diagnosis not present

## 2017-09-03 DIAGNOSIS — Z7901 Long term (current) use of anticoagulants: Secondary | ICD-10-CM | POA: Diagnosis not present

## 2017-09-03 DIAGNOSIS — M545 Low back pain: Secondary | ICD-10-CM | POA: Diagnosis not present

## 2017-09-03 DIAGNOSIS — Z6836 Body mass index (BMI) 36.0-36.9, adult: Secondary | ICD-10-CM | POA: Diagnosis not present

## 2017-09-03 DIAGNOSIS — G8929 Other chronic pain: Secondary | ICD-10-CM | POA: Diagnosis not present

## 2017-09-03 DIAGNOSIS — S066X9A Traumatic subarachnoid hemorrhage with loss of consciousness of unspecified duration, initial encounter: Secondary | ICD-10-CM | POA: Diagnosis not present

## 2017-09-03 DIAGNOSIS — S066X0A Traumatic subarachnoid hemorrhage without loss of consciousness, initial encounter: Secondary | ICD-10-CM

## 2017-09-03 DIAGNOSIS — R410 Disorientation, unspecified: Secondary | ICD-10-CM | POA: Diagnosis not present

## 2017-09-03 DIAGNOSIS — R531 Weakness: Secondary | ICD-10-CM | POA: Diagnosis present

## 2017-09-03 DIAGNOSIS — I609 Nontraumatic subarachnoid hemorrhage, unspecified: Secondary | ICD-10-CM

## 2017-09-03 DIAGNOSIS — M5412 Radiculopathy, cervical region: Secondary | ICD-10-CM

## 2017-09-03 HISTORY — DX: Blindness, one eye, unspecified eye: H54.40

## 2017-09-03 HISTORY — DX: Cerebral infarction, unspecified: I63.9

## 2017-09-03 LAB — PROTIME-INR
INR: 0.98
Prothrombin Time: 12.9 seconds (ref 11.4–15.2)

## 2017-09-03 LAB — I-STAT CHEM 8, ED
BUN: 14 mg/dL (ref 6–20)
CHLORIDE: 109 mmol/L (ref 101–111)
CREATININE: 0.8 mg/dL (ref 0.61–1.24)
Calcium, Ion: 1.03 mmol/L — ABNORMAL LOW (ref 1.15–1.40)
GLUCOSE: 84 mg/dL (ref 65–99)
HCT: 44 % (ref 39.0–52.0)
Hemoglobin: 15 g/dL (ref 13.0–17.0)
Potassium: 3.9 mmol/L (ref 3.5–5.1)
SODIUM: 137 mmol/L (ref 135–145)
TCO2: 24 mmol/L (ref 22–32)

## 2017-09-03 LAB — CBC WITH DIFFERENTIAL/PLATELET
Basophils Absolute: 0 10*3/uL (ref 0.0–0.1)
Basophils Relative: 1 %
EOS ABS: 0.1 10*3/uL (ref 0.0–0.7)
Eosinophils Relative: 2 %
HCT: 42.8 % (ref 39.0–52.0)
HEMOGLOBIN: 14 g/dL (ref 13.0–17.0)
LYMPHS ABS: 1.9 10*3/uL (ref 0.7–4.0)
LYMPHS PCT: 35 %
MCH: 25.4 pg — ABNORMAL LOW (ref 26.0–34.0)
MCHC: 32.7 g/dL (ref 30.0–36.0)
MCV: 77.5 fL — AB (ref 78.0–100.0)
MONOS PCT: 12 %
Monocytes Absolute: 0.6 10*3/uL (ref 0.1–1.0)
NEUTROS PCT: 50 %
Neutro Abs: 2.7 10*3/uL (ref 1.7–7.7)
Platelets: 248 10*3/uL (ref 150–400)
RBC: 5.52 MIL/uL (ref 4.22–5.81)
RDW: 13.7 % (ref 11.5–15.5)
WBC: 5.4 10*3/uL (ref 4.0–10.5)

## 2017-09-03 LAB — SAMPLE TO BLOOD BANK

## 2017-09-03 MED ORDER — PANTOPRAZOLE SODIUM 40 MG PO TBEC
40.0000 mg | DELAYED_RELEASE_TABLET | Freq: Two times a day (BID) | ORAL | Status: DC
  Administered 2017-09-03 – 2017-09-05 (×4): 40 mg via ORAL
  Filled 2017-09-03 (×4): qty 1

## 2017-09-03 MED ORDER — HYDROMORPHONE HCL 1 MG/ML IJ SOLN
1.0000 mg | INTRAMUSCULAR | Status: DC | PRN
  Administered 2017-09-03 – 2017-09-04 (×6): 1 mg via INTRAVENOUS
  Filled 2017-09-03 (×6): qty 1

## 2017-09-03 MED ORDER — PROTHROMBIN COMPLEX CONC HUMAN 500 UNITS IV KIT
5026.0000 [IU] | PACK | Status: AC
  Administered 2017-09-03: 5026 [IU] via INTRAVENOUS
  Filled 2017-09-03: qty 5026

## 2017-09-03 MED ORDER — IOPAMIDOL (ISOVUE-300) INJECTION 61%
INTRAVENOUS | Status: AC
  Administered 2017-09-03: 100 mL
  Filled 2017-09-03: qty 100

## 2017-09-03 MED ORDER — MORPHINE SULFATE (PF) 4 MG/ML IV SOLN
2.0000 mg | INTRAVENOUS | Status: DC | PRN
  Administered 2017-09-03: 4 mg via INTRAVENOUS
  Administered 2017-09-03: 2 mg via INTRAVENOUS
  Administered 2017-09-03 – 2017-09-04 (×2): 4 mg via INTRAVENOUS
  Filled 2017-09-03 (×4): qty 1

## 2017-09-03 MED ORDER — GABAPENTIN 100 MG PO CAPS
200.0000 mg | ORAL_CAPSULE | Freq: Two times a day (BID) | ORAL | Status: DC
  Administered 2017-09-03 – 2017-09-05 (×4): 200 mg via ORAL
  Filled 2017-09-03 (×4): qty 2

## 2017-09-03 MED ORDER — OXYCODONE-ACETAMINOPHEN 10-325 MG PO TABS: 1.0000 | ORAL_TABLET | ORAL | Status: DC | PRN

## 2017-09-03 MED ORDER — TORSEMIDE 20 MG PO TABS
20.0000 mg | ORAL_TABLET | ORAL | Status: DC
  Administered 2017-09-04: 20 mg via ORAL
  Filled 2017-09-03 (×3): qty 1

## 2017-09-03 MED ORDER — PNEUMOCOCCAL VAC POLYVALENT 25 MCG/0.5ML IJ INJ
0.5000 mL | INJECTION | INTRAMUSCULAR | Status: AC
  Administered 2017-09-05: 0.5 mL via INTRAMUSCULAR
  Filled 2017-09-03: qty 0.5

## 2017-09-03 MED ORDER — POTASSIUM CHLORIDE IN NACL 20-0.9 MEQ/L-% IV SOLN
INTRAVENOUS | Status: DC
  Administered 2017-09-03: 23:00:00 via INTRAVENOUS
  Filled 2017-09-03 (×2): qty 1000

## 2017-09-03 MED ORDER — ACETAMINOPHEN 325 MG PO TABS: 650.0000 mg | ORAL_TABLET | ORAL | Status: DC | PRN

## 2017-09-03 MED ORDER — SODIUM CHLORIDE 0.9 % IV SOLN
500.0000 mg | Freq: Two times a day (BID) | INTRAVENOUS | Status: DC
  Administered 2017-09-03 – 2017-09-05 (×4): 500 mg via INTRAVENOUS
  Filled 2017-09-03 (×5): qty 5

## 2017-09-03 MED ORDER — ONDANSETRON HCL 4 MG/2ML IJ SOLN
4.0000 mg | Freq: Four times a day (QID) | INTRAMUSCULAR | Status: DC | PRN
  Administered 2017-09-04 (×2): 4 mg via INTRAVENOUS
  Filled 2017-09-03 (×2): qty 2

## 2017-09-03 MED ORDER — CITALOPRAM HYDROBROMIDE 20 MG PO TABS
20.0000 mg | ORAL_TABLET | Freq: Every day | ORAL | Status: DC
  Administered 2017-09-05: 20 mg via ORAL
  Filled 2017-09-03: qty 1

## 2017-09-03 MED ORDER — OXYCODONE HCL 5 MG PO TABS
5.0000 mg | ORAL_TABLET | ORAL | Status: DC | PRN
Start: 2017-09-03 — End: 2017-09-05
  Administered 2017-09-03 – 2017-09-05 (×4): 5 mg via ORAL
  Filled 2017-09-03 (×4): qty 1

## 2017-09-03 MED ORDER — OXYCODONE-ACETAMINOPHEN 5-325 MG PO TABS
1.0000 | ORAL_TABLET | ORAL | Status: DC | PRN
Start: 2017-09-03 — End: 2017-09-05
  Administered 2017-09-04 – 2017-09-05 (×4): 1 via ORAL
  Filled 2017-09-03 (×5): qty 1

## 2017-09-03 MED ORDER — INFLUENZA VAC SPLIT QUAD 0.5 ML IM SUSY
0.5000 mL | PREFILLED_SYRINGE | INTRAMUSCULAR | Status: AC
  Administered 2017-09-05: 0.5 mL via INTRAMUSCULAR
  Filled 2017-09-03: qty 0.5

## 2017-09-03 MED ORDER — ONDANSETRON 4 MG PO TBDP: 4.0000 mg | ORAL_TABLET | Freq: Four times a day (QID) | ORAL | Status: DC | PRN
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
## 2017-09-03 NOTE — ED Notes (Signed)
Ginger Chase PicketLineberry (wife) contact info: [phone] Please call when admitted to floor

## 2017-09-03 NOTE — ED Notes (Signed)
Virl DiamondChuck (father) [phone] cell [phone] (Chuck's wife)

## 2017-09-03 NOTE — ED Notes (Signed)
Care handoff to be given bedside, pt updated on plan of care Provided chicken broth at this time

## 2017-09-04 ENCOUNTER — Inpatient Hospital Stay (HOSPITAL_COMMUNITY): Payer: Medicaid Other

## 2017-09-04 ENCOUNTER — Other Ambulatory Visit: Payer: Self-pay

## 2017-09-04 LAB — CBC
HCT: 39.3 % (ref 39.0–52.0)
Hemoglobin: 12.5 g/dL — ABNORMAL LOW (ref 13.0–17.0)
MCH: 25.3 pg — AB (ref 26.0–34.0)
MCHC: 31.8 g/dL (ref 30.0–36.0)
MCV: 79.4 fL (ref 78.0–100.0)
PLATELETS: 213 10*3/uL (ref 150–400)
RBC: 4.95 MIL/uL (ref 4.22–5.81)
RDW: 14.3 % (ref 11.5–15.5)
WBC: 6.1 10*3/uL (ref 4.0–10.5)

## 2017-09-04 LAB — BASIC METABOLIC PANEL
Anion gap: 10 (ref 5–15)
BUN: 12 mg/dL (ref 6–20)
CO2: 24 mmol/L (ref 22–32)
Calcium: 8.7 mg/dL — ABNORMAL LOW (ref 8.9–10.3)
Chloride: 102 mmol/L (ref 101–111)
Creatinine, Ser: 0.87 mg/dL (ref 0.61–1.24)
GFR calc Af Amer: >60 mL/min (ref >60)
Glucose, Bld: 92 mg/dL (ref 65–99)
POTASSIUM: 4.2 mmol/L (ref 3.5–5.1)
SODIUM: 136 mmol/L (ref 135–145)

## 2017-09-04 LAB — MRSA PCR SCREENING: MRSA by PCR: NEGATIVE

## 2017-09-04 MED ORDER — DOCUSATE SODIUM 100 MG PO CAPS
100.0000 mg | ORAL_CAPSULE | Freq: Two times a day (BID) | ORAL | Status: DC
  Administered 2017-09-04 – 2017-09-05 (×2): 100 mg via ORAL
  Filled 2017-09-04 (×2): qty 1

## 2017-09-04 NOTE — Progress Notes (Signed)
Shortly after patient arrived from ED, patient complained of chest pain. Unsure if this was a new problem, so STAT EKG was performed and read NSR. Trauma MD made aware. New order of dilaudid pain medicine given. Will continue to monitor.

## 2017-09-04 NOTE — Progress Notes (Signed)
1245 Received patient to 6N. Patient alert oriented x4. Resting comfortably in bed.

## 2017-09-04 NOTE — Progress Notes (Signed)
Late entry. Reviewed repeat head CT this morning No evidence of SAH. Question ?artifact on initial. Regardless, would rec completing 7 day Keppra course His LUE weakness is chronic in nature. He does not need to stay inpatient for the myelogram. We can follow up outpt and order as necessary.
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
## 2017-09-05 ENCOUNTER — Encounter (HOSPITAL_COMMUNITY): Payer: Self-pay | Admitting: Physical Therapy

## 2017-09-05 LAB — BASIC METABOLIC PANEL
Anion gap: 10 (ref 5–15)
BUN: 16 mg/dL (ref 6–20)
CALCIUM: 8.7 mg/dL — AB (ref 8.9–10.3)
CHLORIDE: 103 mmol/L (ref 101–111)
CO2: 25 mmol/L (ref 22–32)
CREATININE: 0.86 mg/dL (ref 0.61–1.24)
GFR calc Af Amer: >60 mL/min (ref >60)
Glucose, Bld: 84 mg/dL (ref 65–99)
Potassium: 3.9 mmol/L (ref 3.5–5.1)
SODIUM: 138 mmol/L (ref 135–145)

## 2017-09-05 LAB — CBC
HCT: 40.4 % (ref 39.0–52.0)
Hemoglobin: 13.3 g/dL (ref 13.0–17.0)
MCH: 25.8 pg — AB (ref 26.0–34.0)
MCHC: 32.9 g/dL (ref 30.0–36.0)
MCV: 78.4 fL (ref 78.0–100.0)
PLATELETS: 228 10*3/uL (ref 150–400)
RBC: 5.15 MIL/uL (ref 4.22–5.81)
RDW: 13.8 % (ref 11.5–15.5)
WBC: 6.2 10*3/uL (ref 4.0–10.5)

## 2017-09-05 MED ORDER — OXYCODONE HCL 5 MG PO TABS: 5.0000 mg | ORAL_TABLET | Freq: Four times a day (QID) | ORAL | 0 refills | Status: DC | PRN

## 2017-09-05 MED ORDER — LEVETIRACETAM 500 MG PO TABS
500.0000 mg | ORAL_TABLET | Freq: Two times a day (BID) | ORAL | Status: DC
  Administered 2017-09-05: 500 mg via ORAL
  Filled 2017-09-05: qty 1

## 2017-09-05 MED ORDER — HYDROMORPHONE HCL 1 MG/ML IJ SOLN: 1.0000 mg | Freq: Four times a day (QID) | INTRAMUSCULAR | Status: DC | PRN

## 2017-09-05 MED ORDER — HYDROMORPHONE HCL 1 MG/ML IJ SOLN: 1.0000 mg | INTRAMUSCULAR | Status: DC | PRN

## 2017-09-05 MED ORDER — LEVETIRACETAM 500 MG PO TABS: 500.0000 mg | ORAL_TABLET | Freq: Two times a day (BID) | ORAL | 0 refills | Status: DC

## 2017-09-05 MED ORDER — ACETAMINOPHEN 325 MG PO TABS
650.0000 mg | ORAL_TABLET | Freq: Four times a day (QID) | ORAL | Status: DC
  Administered 2017-09-05 (×2): 650 mg via ORAL
  Filled 2017-09-05 (×2): qty 2

## 2017-09-05 MED ORDER — RIVAROXABAN 20 MG PO TABS: 20.0000 mg | ORAL_TABLET | Freq: Every day | ORAL | Status: AC

## 2017-09-05 MED ORDER — OXYCODONE HCL 5 MG PO TABS
5.0000 mg | ORAL_TABLET | ORAL | Status: DC | PRN
  Administered 2017-09-05 (×2): 10 mg via ORAL
  Filled 2017-09-05 (×2): qty 2

## 2017-09-05 MED ORDER — RIVAROXABAN 20 MG PO TABS: 20.0000 mg | ORAL_TABLET | Freq: Every day | ORAL | Status: DC
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
## 2017-09-05 NOTE — Discharge Instructions (Addendum)
**Restart your Xarelto on 09/07/17  Subarachnoid Hemorrhage Subarachnoid hemorrhage is bleeding in the area between the brain and the membrane that covers the brain. The bleeding puts more pressure on the brain and stops blood from reaching some areas of the brain. It is very serious. It may cause brain damage, stroke, or death if not treated. You must be treated in the hospital right away. What increases the risk? You may be more likely to have this condition if you:  Smoke.  Have high blood pressure (hypertension).  Drink too much alcohol.  Are a male, especially after menopause.  Have a family history of disease in the blood vessels of the brain.  Have a certain inherited kidney disease or connective tissue disease.  What are the signs or symptoms?  Having a sudden, severe headache.  Feeling sick to your stomach (nauseous) or throwing up (vomiting) combined with other problems.  Suddenly feeling weak.  Losing feeling on your face, arm, or leg, especially on one side of the body.  Suddenly having trouble walking or moving your arms or legs.  Suddenly feeling confused.  Suddenly having a change in mood or personality.  Having trouble talking or understanding.  Having trouble swallowing.  Suddenly having trouble seeing.  Seeing double.  Feeling dizzy.  Losing your balance or coordination.  Having light bother or hurt your eyes.  Having a stiff neck. Follow these instructions at home:  Take all medicines exactly as told by your doctor.  Eat healthy foods if you can swallow. ? Eat foods that are low in salt and cholesterol. ? Eat foods that are low in saturated and trans fat. ? If told, eat soft or pureed foods so that you do not choke. ? If told, take small bites of food so that you do not choke.  Rest as told by your doctor.  Limit your activity as told by your doctor.  Do not smoke.  Limit how much alcohol you drink. ? Men-drink no more than 2  drinks a day. ? Women who are not pregnant-drink no more than 1 drink a day.  Make changes to your lifestyle as told by your doctor.  Keep track of your blood pressure as told by your doctor.  Keep your home safe so you do not fall. ? Put grab bars in the bedroom and bathroom. ? Raise toilet seats. ? Put a seat in the shower.  Go to therapy sessions as told by your doctor. This may include physical, occupational, and speech therapy.  Use a walker or cane at all times, if told to do so.  Keep all follow-up visits with your doctor and other specialists. Get help right away if:  You have a sudden, severe headache with no known cause.  You are sick to your stomach or throw up, and have another problem.  You have a sudden weakness.  You lose feeling on one side of your body.  You suddenly have trouble walking or moving arms or legs.  You suddenly feel confused.  You have trouble talking or understanding.  You suddenly have trouble seeing.  You lose your balance or your movements are not coordinated.  You have a stiff neck.  You have trouble breathing.  You are partly or totally unaware of what is going on around you. The symptoms above may be a sign of a serious problem that is an emergency. Do not wait to see if the symptoms will go away. Get medical help right away. Call  your local emergency services (911 in [place] Do not drive yourself to the hospital. This information is not intended to replace advice given to you by your health care provider. Make sure you discuss any questions you have with your health care provider. Document Released: 11/20/2012 Document Revised: 01/01/2016 Document Reviewed: 09/08/2012 Elsevier Interactive Patient Education  [employer].

## 2017-09-05 NOTE — Clinical Social Work Note (Addendum)
CSW completed SBIRT at bedside. Pt is prepared to d/c today after he works with PT. Pt lives with his spouse. Pt denies any concerns at this time. Consult was placed for CSW regarding Advanced Directives--please consult spiritual care.   Clinical Social Worker will sign off for now as social work intervention is no longer needed. Please consult us again if new need arises.   Clarisse GougeBridget A Gatsby Chismar 09/05/2017
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
## 2017-09-05 NOTE — Progress Notes (Signed)
Pt discharged to home with wife.  Discharge instructions and prescriptions given.  

## 2017-09-06 ENCOUNTER — Encounter: Payer: Self-pay | Admitting: *Deleted

## 2017-09-06 LAB — HIV ANTIBODY (ROUTINE TESTING W REFLEX): HIV Screen 4th Generation wRfx: NONREACTIVE
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
## 2017-11-04 ENCOUNTER — Emergency Department (HOSPITAL_COMMUNITY): Payer: Medicaid Other

## 2017-11-04 ENCOUNTER — Encounter (HOSPITAL_COMMUNITY): Payer: Self-pay | Admitting: Emergency Medicine

## 2017-11-04 ENCOUNTER — Observation Stay (HOSPITAL_COMMUNITY)
Admission: EM | Admit: 2017-11-04 | Discharge: 2017-11-06 | Disposition: A | Discharge status: Home / Self Care | Payer: Medicaid Other | Attending: Physician Assistant | Admitting: Physician Assistant

## 2017-11-04 DIAGNOSIS — Z7901 Long term (current) use of anticoagulants: Secondary | ICD-10-CM | POA: Insufficient documentation

## 2017-11-04 DIAGNOSIS — R0789 Other chest pain: Secondary | ICD-10-CM | POA: Diagnosis not present

## 2017-11-04 DIAGNOSIS — H538 Other visual disturbances: Secondary | ICD-10-CM | POA: Insufficient documentation

## 2017-11-04 DIAGNOSIS — I509 Heart failure, unspecified: Secondary | ICD-10-CM | POA: Diagnosis not present

## 2017-11-04 DIAGNOSIS — M545 Low back pain, unspecified: Secondary | ICD-10-CM | POA: Diagnosis present

## 2017-11-04 DIAGNOSIS — G43909 Migraine, unspecified, not intractable, without status migrainosus: Secondary | ICD-10-CM | POA: Diagnosis present

## 2017-11-04 DIAGNOSIS — F411 Generalized anxiety disorder: Secondary | ICD-10-CM

## 2017-11-04 DIAGNOSIS — R531 Weakness: Secondary | ICD-10-CM

## 2017-11-04 DIAGNOSIS — H5440 Blindness, one eye, unspecified eye: Secondary | ICD-10-CM | POA: Diagnosis not present

## 2017-11-04 DIAGNOSIS — R5383 Other fatigue: Secondary | ICD-10-CM | POA: Diagnosis present

## 2017-11-04 DIAGNOSIS — R51 Headache: Secondary | ICD-10-CM | POA: Insufficient documentation

## 2017-11-04 DIAGNOSIS — M6289 Other specified disorders of muscle: Principal | ICD-10-CM

## 2017-11-04 DIAGNOSIS — I639 Cerebral infarction, unspecified: Secondary | ICD-10-CM

## 2017-11-04 DIAGNOSIS — Z79899 Other long term (current) drug therapy: Secondary | ICD-10-CM | POA: Insufficient documentation

## 2017-11-04 DIAGNOSIS — Z8673 Personal history of transient ischemic attack (TIA), and cerebral infarction without residual deficits: Secondary | ICD-10-CM | POA: Insufficient documentation

## 2017-11-04 DIAGNOSIS — R4189 Other symptoms and signs involving cognitive functions and awareness: Secondary | ICD-10-CM | POA: Diagnosis not present

## 2017-11-04 DIAGNOSIS — I609 Nontraumatic subarachnoid hemorrhage, unspecified: Secondary | ICD-10-CM

## 2017-11-04 LAB — COMPREHENSIVE METABOLIC PANEL
ALBUMIN: 4 g/dL (ref 3.5–5.0)
ALK PHOS: 59 U/L (ref 38–126)
ALT: 13 U/L — ABNORMAL LOW (ref 17–63)
ANION GAP: 12 (ref 5–15)
AST: 21 U/L (ref 15–41)
BILIRUBIN TOTAL: 0.7 mg/dL (ref 0.3–1.2)
BUN: 12 mg/dL (ref 6–20)
CALCIUM: 8.6 mg/dL — AB (ref 8.9–10.3)
CO2: 23 mmol/L (ref 22–32)
Chloride: 103 mmol/L (ref 101–111)
Creatinine, Ser: 1.23 mg/dL (ref 0.61–1.24)
GFR calc Af Amer: >60 mL/min (ref >60)
Glucose, Bld: 84 mg/dL (ref 65–99)
Potassium: 3.4 mmol/L — ABNORMAL LOW (ref 3.5–5.1)
Sodium: 138 mmol/L (ref 135–145)
TOTAL PROTEIN: 6.8 g/dL (ref 6.5–8.1)

## 2017-11-04 LAB — I-STAT CHEM 8, ED
BUN: 13 mg/dL (ref 6–20)
CALCIUM ION: 0.97 mmol/L — AB (ref 1.15–1.40)
CREATININE: 1.2 mg/dL (ref 0.61–1.24)
Chloride: 102 mmol/L (ref 101–111)
GLUCOSE: 84 mg/dL (ref 65–99)
HCT: 37 % — ABNORMAL LOW (ref 39.0–52.0)
Hemoglobin: 12.6 g/dL — ABNORMAL LOW (ref 13.0–17.0)
Potassium: 3.3 mmol/L — ABNORMAL LOW (ref 3.5–5.1)
Sodium: 140 mmol/L (ref 135–145)
TCO2: 24 mmol/L (ref 22–32)

## 2017-11-04 LAB — TROPONIN I: Troponin I: <0.03 ng/mL (ref <0.03)

## 2017-11-04 LAB — I-STAT TROPONIN, ED: TROPONIN I, POC: 0 ng/mL (ref 0.00–0.08)

## 2017-11-04 LAB — CBC
HEMATOCRIT: 36.9 % — AB (ref 39.0–52.0)
HEMOGLOBIN: 11.8 g/dL — AB (ref 13.0–17.0)
MCH: 24.6 pg — AB (ref 26.0–34.0)
MCHC: 32 g/dL (ref 30.0–36.0)
MCV: 76.9 fL — ABNORMAL LOW (ref 78.0–100.0)
Platelets: 242 10*3/uL (ref 150–400)
RBC: 4.8 MIL/uL (ref 4.22–5.81)
RDW: 15.7 % — ABNORMAL HIGH (ref 11.5–15.5)
WBC: 5.3 10*3/uL (ref 4.0–10.5)

## 2017-11-04 LAB — I-STAT ARTERIAL BLOOD GAS, ED
ACID-BASE EXCESS: 2 mmol/L (ref 0.0–2.0)
BICARBONATE: 26.6 mmol/L (ref 20.0–28.0)
O2 Saturation: 96 %
PH ART: 7.408 (ref 7.350–7.450)
TCO2: 28 mmol/L (ref 22–32)
pCO2 arterial: 42 mmHg (ref 32.0–48.0)
pO2, Arterial: 83 mmHg (ref 83.0–108.0)

## 2017-11-04 LAB — DIFFERENTIAL
Basophils Absolute: 0 10*3/uL (ref 0.0–0.1)
Basophils Relative: 1 %
EOS ABS: 0.2 10*3/uL (ref 0.0–0.7)
EOS PCT: 4 %
LYMPHS ABS: 2.7 10*3/uL (ref 0.7–4.0)
LYMPHS PCT: 50 %
MONOS PCT: 8 %
Monocytes Absolute: 0.4 10*3/uL (ref 0.1–1.0)
NEUTROS PCT: 37 %
Neutro Abs: 2 10*3/uL (ref 1.7–7.7)

## 2017-11-04 LAB — LIPID PANEL
CHOL/HDL RATIO: 3.1 ratio
Cholesterol: 139 mg/dL (ref 0–200)
HDL: 45 mg/dL (ref >40)
LDL CALC: 80 mg/dL (ref 0–99)
Triglycerides: 69 mg/dL (ref <150)
VLDL: 14 mg/dL (ref 0–40)

## 2017-11-04 LAB — HEMOGLOBIN A1C
Hgb A1c MFr Bld: 5.2 % (ref 4.8–5.6)
MEAN PLASMA GLUCOSE: 102.54 mg/dL

## 2017-11-04 LAB — RAPID URINE DRUG SCREEN, HOSP PERFORMED
Amphetamines: NOT DETECTED
BARBITURATES: NOT DETECTED
Benzodiazepines: NOT DETECTED
Cocaine: NOT DETECTED
Opiates: NOT DETECTED
TETRAHYDROCANNABINOL: NOT DETECTED

## 2017-11-04 LAB — APTT: aPTT: 30 seconds (ref 24–36)

## 2017-11-04 LAB — MAGNESIUM: MAGNESIUM: 2.4 mg/dL (ref 1.7–2.4)

## 2017-11-04 LAB — CBG MONITORING, ED: Glucose-Capillary: 89 mg/dL (ref 65–99)

## 2017-11-04 LAB — PROTIME-INR
INR: 1.19
Prothrombin Time: 15 seconds (ref 11.4–15.2)

## 2017-11-04 MED ORDER — PANTOPRAZOLE SODIUM 40 MG PO TBEC
40.0000 mg | DELAYED_RELEASE_TABLET | Freq: Two times a day (BID) | ORAL | Status: DC
  Administered 2017-11-04 – 2017-11-06 (×4): 40 mg via ORAL
  Filled 2017-11-04 (×4): qty 1

## 2017-11-04 MED ORDER — HYDROCODONE-ACETAMINOPHEN 7.5-325 MG PO TABS: 1.0000 | ORAL_TABLET | Freq: Two times a day (BID) | ORAL | Status: DC

## 2017-11-04 MED ORDER — SENNOSIDES-DOCUSATE SODIUM 8.6-50 MG PO TABS: 1.0000 | ORAL_TABLET | Freq: Every evening | ORAL | Status: DC | PRN

## 2017-11-04 MED ORDER — LEVETIRACETAM 750 MG PO TABS
750.0000 mg | ORAL_TABLET | Freq: Two times a day (BID) | ORAL | Status: DC
  Administered 2017-11-04 – 2017-11-06 (×5): 750 mg via ORAL
  Filled 2017-11-04 (×7): qty 1

## 2017-11-04 MED ORDER — ASPIRIN EC 81 MG PO TBEC
81.0000 mg | DELAYED_RELEASE_TABLET | Freq: Every day | ORAL | Status: DC
  Administered 2017-11-05 – 2017-11-06 (×2): 81 mg via ORAL
  Filled 2017-11-04 (×2): qty 1

## 2017-11-04 MED ORDER — CITALOPRAM HYDROBROMIDE 10 MG PO TABS
20.0000 mg | ORAL_TABLET | Freq: Every day | ORAL | Status: DC
  Administered 2017-11-05 – 2017-11-06 (×2): 20 mg via ORAL
  Filled 2017-11-04 (×2): qty 2

## 2017-11-04 MED ORDER — TORSEMIDE 20 MG PO TABS: 20.0000 mg | ORAL_TABLET | ORAL | Status: DC | PRN

## 2017-11-04 MED ORDER — ATORVASTATIN CALCIUM 40 MG PO TABS
40.0000 mg | ORAL_TABLET | Freq: Every day | ORAL | Status: DC
  Administered 2017-11-04 – 2017-11-05 (×2): 40 mg via ORAL
  Filled 2017-11-04 (×2): qty 1

## 2017-11-04 MED ORDER — STROKE: EARLY STAGES OF RECOVERY BOOK
Freq: Once | Status: DC
  Filled 2017-11-04: qty 1

## 2017-11-04 MED ORDER — RIVAROXABAN 20 MG PO TABS
20.0000 mg | ORAL_TABLET | Freq: Every day | ORAL | Status: DC
  Administered 2017-11-04 – 2017-11-05 (×2): 20 mg via ORAL
  Filled 2017-11-04 (×2): qty 1

## 2017-11-04 MED ORDER — OXYCODONE-ACETAMINOPHEN 5-325 MG PO TABS
2.0000 | ORAL_TABLET | Freq: Four times a day (QID) | ORAL | Status: AC | PRN
Start: 2017-11-04 — End: 2017-11-05
  Administered 2017-11-04 – 2017-11-05 (×2): 2 via ORAL
  Filled 2017-11-04 (×2): qty 2

## 2017-11-04 MED ORDER — SIMVASTATIN 10 MG PO TABS: 10.0000 mg | ORAL_TABLET | Freq: Every day | ORAL | Status: DC

## 2017-11-04 MED ORDER — IOPAMIDOL (ISOVUE-370) INJECTION 76%
INTRAVENOUS | Status: AC
  Administered 2017-11-04: 50 mL
  Filled 2017-11-04: qty 50

## 2017-11-04 MED ORDER — HYDRALAZINE HCL 20 MG/ML IJ SOLN: 5.0000 mg | Freq: Three times a day (TID) | INTRAMUSCULAR | Status: DC | PRN

## 2017-11-04 MED ORDER — SODIUM CHLORIDE 0.9 % IV SOLN: INTRAVENOUS | Status: DC

## 2017-11-04 MED ORDER — ACETAMINOPHEN 650 MG RE SUPP: 650.0000 mg | RECTAL | Status: DC | PRN

## 2017-11-04 MED ORDER — SODIUM CHLORIDE 0.9 % IV SOLN
1.0000 g | Freq: Once | INTRAVENOUS | Status: AC
  Administered 2017-11-04: 1 g via INTRAVENOUS
  Filled 2017-11-04: qty 10

## 2017-11-04 MED ORDER — ACETAMINOPHEN 160 MG/5ML PO SOLN: 650.0000 mg | ORAL | Status: DC | PRN

## 2017-11-04 MED ORDER — ACETAMINOPHEN 325 MG PO TABS
650.0000 mg | ORAL_TABLET | ORAL | Status: DC | PRN
  Administered 2017-11-04 – 2017-11-06 (×3): 650 mg via ORAL
  Filled 2017-11-04 (×3): qty 2

## 2017-11-04 MED ORDER — OXYCODONE HCL 5 MG PO TABS: 5.0000 mg | ORAL_TABLET | Freq: Four times a day (QID) | ORAL | Status: DC | PRN

## 2017-11-04 MED ORDER — TOPIRAMATE 25 MG PO TABS
25.0000 mg | ORAL_TABLET | Freq: Two times a day (BID) | ORAL | Status: DC
  Administered 2017-11-04 – 2017-11-06 (×4): 25 mg via ORAL
  Filled 2017-11-04 (×5): qty 1

## 2017-11-04 MED ORDER — GABAPENTIN 100 MG PO CAPS
200.0000 mg | ORAL_CAPSULE | Freq: Two times a day (BID) | ORAL | Status: DC
  Administered 2017-11-04 – 2017-11-06 (×4): 200 mg via ORAL
  Filled 2017-11-04 (×4): qty 2

## 2017-11-04 MED ORDER — LEVETIRACETAM 500 MG PO TABS: 500.0000 mg | ORAL_TABLET | Freq: Two times a day (BID) | ORAL | Status: DC

## 2017-11-04 NOTE — ED Notes (Signed)
Ordered dinner tray.  

## 2017-11-04 NOTE — ED Notes (Signed)
Attempted report x1. 

## 2017-11-04 NOTE — ED Notes (Signed)
Attempted report x 2 
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
## 2017-11-04 NOTE — Code Documentation (Signed)
42 yo male coming from MD Office after he was noted to become unresponsive while waiting for appt. Pt saw his wife last night at 2300 at baseline. Pt drive himself to the MD for an appt. this morning. Office called EMS and EMS activated Code Stroke. Pt was noted to have slurred speech and worsening left sided weakness. Hx of Stroke with left sided deficit per baseline, but PCP reported the weakness was worse. Arrived to the ED. EMS reported multiple episode where patient would be talking and then become unresponsive. Multiple episodes noted during Neuro Assessment and pt was woken up by sternal rub. Stroke Team met patient at the bridge. CT Negative. NIHSS 2 for drowsiness and left leg drift (reported at baseline). Conjugate gaze and left eye blindness reported at baseline. Code Stroke cancelled.
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
## 2017-11-04 NOTE — H&P (Addendum)
History and Physical    Richard Benson PPI:[id number] DOB: 1976/04/13 DOA: 11/04/2017   PCP: Biagio Borg, MD   Patient coming from:  Home    Chief Complaint: Left sided weakness   HPI: Richard Benson is a 42 y.o. male with medical history significant for prior atrial fibrillation noncompliant with Xarelto, history of CHF,and history of [employer] AICD, history of SAH, history of CVA, with left eye blindness residual, presenting to the emergency department from his PCPs office, with increased lethargy.  The patient had perceived EMS report 2-3 episodes of brief unresponsiveness, described as syncopal, without arrhythmias or apneic events.  He also endorsed left arm weakness, beginning sometime within 30 minutes to an hour prior to arrival.  The left arm weakness never resolved.  He also reports intermittent diplopia, with pain in the right eye.  He denies any seizure activity, incontinence, or tongue biting.  No bowel incontinence.  He denies any numbness.  He also reported constant low back pain, but this is chronic, radiating down to the right leg.  Presentation, the patient did not no while he was in the hospital.  He denies any fever or chills, shortness of breath, or emesis.  His last dose of Xarelto was 3 days ago, after running out of medication, and not refilling.  He also stated that his PCP discontinued his seizure medication Keppra.  Of note, the patient reports one event of fall while in the bathroom and night prior, having blurred vision at the time, which resolved quickly.Of note, is important to mention that he had several hospitalizations over the last 2 months, one from 1/21 through 09/01/17 at [facility], for TIA versus complex migraine, leaving AMA, another admission 09/03/2017, for right frontal SAH, discharged on 09/05/2017, time Xarelto was resumed.  Last known well 11/04/2017, possibly at midnight.  He was not a candidate for TPA, as he is outside of the window.  Modified  Rankin score 1.  NIH stroke scale is 9 per neurology report   ED Course:  BP 120/80   Pulse 61   Temp 97.6 F (36.4 C)   Resp 13   Ht 5' 11"  (1.803 m)   Wt 118.1 kg (260 lb 5.8 oz)   SpO2 99%   BMI 36.31 kg/m   Blood gases normal, UDS negative. LDL 80 MRI of the brain was unable to be performed due to pacemaker Change of the head and neck are pending CT of the head negative for acute intracranial abnormalities, previously described a H is not confirmed . Hemoglobin 12.6, hemoglobin A1c 5.2, glucose 84 G sinus rhythm UA is negative Neurology evaluated the patient, started Keppra 750 mg twice daily, and to maintain in seizure precautions and telemetry.  Aspirin, Xarelto and statins are recommended.  Pacemaker  Interrogated, results pending He was hypocalcemic, given IV calcium gluconate at the ED.  Magnesium pending.  review of Systems:  As per HPI otherwise all other systems reviewed and are negative  Past Medical History:  Diagnosis Date  . Blind left eye   . CHF (congestive heart failure) ([place])   . Obesity   . Stroke [facility])     Past Surgical History:  Procedure Laterality Date  . EYE SURGERY      Social History Social History   Socioeconomic History  . Marital status: Single    Spouse name: Not on file  . Number of children: Not on file  . Years of education: Not on file  . Highest  education level: Not on file  Occupational History  . Not on file  Social Needs  . Financial resource strain: Not on file  . Food insecurity:    Worry: Not on file    Inability: Not on file  . Transportation needs:    Medical: Not on file    Non-medical: Not on file  Tobacco Use  . Smoking status: Never Smoker  . Smokeless tobacco: Never Used  Substance and Sexual Activity  . Alcohol use: No    Frequency: Never  . Drug use: No  . Sexual activity: Not on file  Lifestyle  . Physical activity:    Days per week: Not on file    Minutes per session: Not on file  . Stress:  Not on file  Relationships  . Social connections:    Talks on phone: Not on file    Gets together: Not on file    Attends religious service: Not on file    Active member of club or organization: Not on file    Attends meetings of clubs or organizations: Not on file    Relationship status: Not on file  . Intimate partner violence:    Fear of current or ex partner: Not on file    Emotionally abused: Not on file    Physically abused: Not on file    Forced sexual activity: Not on file  Other Topics Concern  . Not on file  Social History Narrative   ** Merged History Encounter **         No Known Allergies  History reviewed. No pertinent family history.    Prior to Admission medications   Medication Sig Start Date End Date Taking? Authorizing Provider  aspirin EC 81 MG tablet Take 81 mg by mouth daily.      [provider]  atorvastatin (LIPITOR) 80 MG tablet Take 80 mg by mouth daily.     [provider]  celecoxib (CELEBREX) 200 MG capsule Take 200 mg by mouth 2 (two) times daily.    [provider]  citalopram (CELEXA) 20 MG tablet Take 20 mg by mouth daily.    [provider]  Cyanocobalamin (VITAMIN B-12 PO) Take 1 tablet by mouth daily.    [provider]  furosemide (LASIX) 40 MG tablet Take 80 mg by mouth 2 (two) times daily.      [provider]  gabapentin (NEURONTIN) 100 MG capsule Take 200 mg by mouth 2 (two) times daily.    [provider]  HYDROcodone-acetaminophen (NORCO) 7.5-325 MG per tablet Take 1 tablet by mouth every 12 (twelve) hours.     [provider]  levETIRAcetam (KEPPRA) 500 MG tablet Take 1 tablet (500 mg total) by mouth 2 (two) times daily. 09/05/17   Meuth, Brooke A, PA-C  Multiple Vitamin (MULTIVITAMIN WITH MINERALS) TABS tablet Take 1 tablet by mouth daily.    [provider]  nitroGLYCERIN (NITROSTAT) 0.4 MG SL tablet Place 0.4 mg under the tongue every 5 (five) minutes  as needed. For chest pain     [provider]  oxyCODONE (OXY IR/ROXICODONE) 5 MG immediate release tablet Take 1-2 tablets (5-10 mg total) by mouth every 6 (six) hours as needed for severe pain. 09/05/17   Meuth, Brooke A, PA-C  pantoprazole (PROTONIX) 40 MG tablet Take 40 mg by mouth 2 (two) times daily.    [provider]  rivaroxaban (XARELTO) 20 MG TABS tablet Take 1 tablet (20 mg total) by  mouth daily with supper. 09/07/17   Meuth, Brooke A, PA-C  simvastatin (ZOCOR) 10 MG tablet Take 10 mg by mouth at bedtime.      [provider]  spironolactone (ALDACTONE) 25 MG tablet Take 25 mg by mouth daily.     [provider]  topiramate (TOPAMAX) 25 MG tablet Take 25 mg by mouth 2 (two) times daily.     [provider]  torsemide (DEMADEX) 20 MG tablet Take 20 mg by mouth every other day.    [provider]    Physical Exam:  Vitals:   11/04/17 1332 11/04/17 1345 11/04/17 1400 11/04/17 1415  BP:  117/84 116/82 120/80  Pulse:  66 65 61  Resp:  13 13 13   Temp: 97.6 F (36.4 C)     TempSrc:      SpO2:  100% 99% 99%  Weight:      Height:       Constitutional: NAD, calm, comfortable, chronically ill appearing  Eyes: Right eye with lateral deviation, patient has left eye blindness at baseline  ENMT: Mucous membranes are moist, without exudate or lesions  Neck: normal, supple, no masses, no thyromegaly Respiratory: clear to auscultation bilaterally, no wheezing, no crackles. Normal respiratory effort  Cardiovascular: Regular rate and rhythm,  murmur, rubs or gallops. No extremity edema. 2+ pedal pulses. No carotid bruits.  Abdomen: Soft, non tender, No hepatosplenomegaly. Bowel sounds positive.  Musculoskeletal: no clubbing / cyanosis. Moves all extremities Skin: no jaundice, No lesions.  Neurologic: Sensation intact  Strength equal in R  extremities, LUE 2/5  And 4/5 on the LLE  Psychiatric:   Alert and oriented x 3. Normal mood.       Labs on Admission: I have personally reviewed following labs and imaging studies  CBC: Recent Labs  Lab 11/04/17 1134 11/04/17 1137  WBC 5.3  --   NEUTROABS 2.0  --   HGB 11.8* 12.6*  HCT 36.9* 37.0*  MCV 76.9*  --   PLT 242  --     Basic Metabolic Panel: Recent Labs  Lab 11/04/17 1134 11/04/17 1137  NA 138 140  K 3.4* 3.3*  CL 103 102  CO2 23  --   GLUCOSE 84 84  BUN 12 13  CREATININE 1.23 1.20  CALCIUM 8.6*  --   MG 2.4  --     GFR: Estimated Creatinine Clearance: 103.7 mL/min (by C-G formula based on SCr of 1.2 mg/dL).  Liver Function Tests: Recent Labs  Lab 11/04/17 1134  AST 21  ALT 13*  ALKPHOS 59  BILITOT 0.7  PROT 6.8  ALBUMIN 4.0   No results for input(s): LIPASE, AMYLASE in the last 168 hours. No results for input(s): AMMONIA in the last 168 hours.  Coagulation Profile: Recent Labs  Lab 11/04/17 1134  INR 1.19    Cardiac Enzymes: No results for input(s): CKTOTAL, CKMB, CKMBINDEX, TROPONINI in the last 168 hours.  BNP (last 3 results) No results for input(s): PROBNP in the last 8760 hours.  HbA1C: Recent Labs    11/04/17 1154  HGBA1C 5.2    CBG: Recent Labs  Lab 11/04/17 1519  GLUCAP 35    Lipid Profile: Recent Labs    11/04/17 1230  CHOL 139  HDL 45  LDLCALC 80  TRIG 69  CHOLHDL 3.1    Thyroid Function Tests: No results for input(s): TSH, T4TOTAL, FREET4, T3FREE, THYROIDAB in the last 72 hours.  Anemia Panel: No results for input(s): VITAMINB12, FOLATE, FERRITIN,  TIBC, IRON, RETICCTPCT in the last 72 hours.  Urine analysis:    Component Value Date/Time   COLORURINE YELLOW 07/07/2011 1708   APPEARANCEUR CLEAR 07/07/2011 1708   LABSPEC 1.028 07/07/2011 1708   PHURINE 6.5 07/07/2011 1708   GLUCOSEU NEGATIVE 07/07/2011 1708   GLUCOSEU NEGATIVE 08/01/2007 1617   HGBUR NEGATIVE 07/07/2011 1708   BILIRUBINUR NEGATIVE 07/07/2011 1708   KETONESUR NEGATIVE 07/07/2011 1708   PROTEINUR NEGATIVE 07/07/2011  1708   UROBILINOGEN 1.0 07/07/2011 1708   NITRITE NEGATIVE 07/07/2011 1708   LEUKOCYTESUR NEGATIVE 07/07/2011 1708    Sepsis Labs: @LABRCNTIP (procalcitonin:4,lacticidven:4) )No results found for this or any previous visit (from the past 240 hour(s)).   Radiological Exams on Admission: Ct Angio Head W Or Wo Contrast  Result Date: 11/04/2017 CLINICAL DATA:  Acute presentation with left hand weakness. EXAM: CT ANGIOGRAPHY HEAD AND NECK TECHNIQUE: Multidetector CT imaging of the head and neck was performed using the standard protocol during bolus administration of intravenous contrast. Multiplanar CT image reconstructions and MIPs were obtained to evaluate the vascular anatomy. Carotid stenosis measurements (when applicable) are obtained utilizing NASCET criteria, using the distal internal carotid diameter as the denominator. CONTRAST:  69m ISOVUE-370 IOPAMIDOL (ISOVUE-370) INJECTION 76% COMPARISON:  CT same day. 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 Mild degenerative cervical spondylosis. Other neck: No mass or lymphadenopathy. Upper chest: Normal except for the presence of a central venous catheter. Review of the MIP images confirms the above findings CTA HEAD FINDINGS Anterior circulation: Both internal carotid arteries are widely patent through the skull base and siphon regions. Left ring for some technical differences of the scanners, I think the intracranial vessels are normal. No stenosis or occlusion. Posterior circulation: Both vertebral arteries are patent to the basilar. No basilar stenosis. Posterior circulation branch vessels show flow. Right PCA takes a fetal origin from the right carotid circulation. As previously, there is slight motion degradation and some technical differentiation,  but no changes suspected. Venous sinuses: Patent and normal. Anatomic variants: None significant Delayed phase: No abnormal enhancement. Review of the MIP images confirms the above findings IMPRESSION: Negative CT angiography of the neck and intracranial vasculature. Today's intracranial study suffers from mild motion degradation and slightly decreased resolution, but I think is unchanged from previous studies and within normal limits. Electronically Signed   By: MNelson ChimesM.D.   On: 11/04/2017 13:21   Ct Angio Neck W Or Wo Contrast  Result Date: 11/04/2017 CLINICAL DATA:  Acute presentation with left hand weakness. EXAM: CT ANGIOGRAPHY HEAD AND NECK TECHNIQUE: Multidetector CT imaging of the head and neck was performed using the standard protocol during bolus administration of intravenous contrast. Multiplanar CT image reconstructions and MIPs were obtained to evaluate the vascular anatomy. Carotid stenosis measurements (when applicable) are obtained utilizing NASCET criteria, using the distal internal carotid diameter as the denominator. CONTRAST:  539mISOVUE-370 IOPAMIDOL (ISOVUE-370) INJECTION 76% COMPARISON:  CT same day. 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 Mild degenerative cervical spondylosis. Other neck: No mass or lymphadenopathy. Upper chest: Normal except for the presence of a central venous catheter. Review of the MIP images confirms the above findings CTA HEAD FINDINGS Anterior circulation: Both internal carotid  arteries are widely patent through the skull base and siphon regions. Left ring for some technical differences of the scanners, I think the intracranial vessels are normal. No stenosis or occlusion. Posterior circulation: Both vertebral arteries are patent to the basilar. No  basilar stenosis. Posterior circulation branch vessels show flow. Right PCA takes a fetal origin from the right carotid circulation. As previously, there is slight motion degradation and some technical differentiation, but no changes suspected. Venous sinuses: Patent and normal. Anatomic variants: None significant Delayed phase: No abnormal enhancement. Review of the MIP images confirms the above findings IMPRESSION: Negative CT angiography of the neck and intracranial vasculature. Today's intracranial study suffers from mild motion degradation and slightly decreased resolution, but I think is unchanged from previous studies and within normal limits. Electronically Signed   By: Nelson Chimes M.D.   On: 11/04/2017 13:21   Ct Head Code Stroke Wo Contrast  Result Date: 11/04/2017 CLINICAL DATA:  Code stroke.  Loss of left grip.  Confusion. EXAM: CT HEAD WITHOUT CONTRAST TECHNIQUE: Contiguous axial images were obtained from the base of the skull through the vertex without intravenous contrast. COMPARISON:  08/29/2017 FINDINGS: Brain: No evidence of infarction, hemorrhage, hydrocephalus, extra-axial collection or mass lesion/mass effect. Vascular: No hyperdense vessel or unexpected calcification. Skull: Normal. Negative for fracture or focal lesion. Sinuses/Orbits: Cataract resection and scleral band on the left. Nonspecific dysconjugate gaze. Other: These results were communicated to Dr. Lorraine Lax at 12:00 pmon 3/29/2019by text page via the [facility] messaging system. ASPECTS [facility] Stroke Program Early CT Score) - Ganglionic level infarction (caudate, lentiform nuclei, internal capsule, insula, M1-M3 cortex): 7 - Supraganglionic infarction (M4-M6 cortex): 3 Total score (0-10 with 10 being normal): 10 IMPRESSION: Negative head CT.  ASPECTS is 10. Electronically Signed   By: Monte Fantasia M.D.   On: 11/04/2017 12:01    EKG: Independently reviewed.  Assessment/Plan Principal Problem:   Stroke [facility]) Active  Problems:   OBESITY, MORBID   Anxiety state   Migraine headache   LOW BACK PAIN   SAH (subarachnoid hemorrhage) (HCC)   Blind left eye   Acute- L sided weakness suspicious for stroke versus migraine versus other   Not a TPA candidate as  last known normal was the night prior to hospitalization. CT head neg for acute findings, unable to perform MRI of the brain due to pacemaker.  CT Angioof the head and neck are neg for acute findings    Admit to Tele obs Stroke order set  Allow permissive HTN Echo  PT/OT/SLP A1C Aspirin  Lipitor 40 mg daily Neuro  Following, appreciate their involvement Resume Xarelto  Await for pacer interrogation results  Repeat CT tomorrow instructed by neuro Patient to follow-up with neurology in about 6 weeks after discharge  History of seizures, noncompliant with meds Keppra 750 mg twice daily EEG Seizure precautions Frequent neurochecks Telemetry monitoring  Chronic atrial fibrillation, the patient was supposed to be on Xarelto, but has been noncompliant with the meds. Of note, the new EKG shows normal sinus rhythm However, due to possible stroke, will continue with Xarelto at this time   Hypocalcemia, calcium was 8.6  Patient was given IV calcium gluconate x1, and magnesium is pending. To be met in a.m.   Hypertension BP 127/66   Pulse 74    Hold  home anti-hypertensive medications  Add Hydralazine Q6 hours as needed for BP 210/110  Hyperlipidemia Statins with LIpitor 40 mg   GERD, no acute symptoms Continue PPI   Depression  Continue home  Celexa  Chronic pain syndrome Continue home pain meds, no acute issues, other than chronic pain well managed as OP at the pain clinic   History of SAH, no acute issues, no bleeding issues Continue Xarelto and monitor closely    DVT prophylaxis:  Xarelto Code Status:    Full  Family Communication:  Discussed with patient Disposition Plan: Expect patient to be discharged to home after condition  improves Consults called:    Neuro per EDP Admission status: Tele Obs   Sharene Butters, PA-C [facility]   Amion text  [phone]   11/04/2017, 4:01 PM

## 2017-11-05 ENCOUNTER — Observation Stay (HOSPITAL_COMMUNITY): Payer: Medicaid Other

## 2017-11-05 ENCOUNTER — Other Ambulatory Visit (HOSPITAL_COMMUNITY): Payer: Medicaid Other

## 2017-11-05 DIAGNOSIS — H5440 Blindness, one eye, unspecified eye: Secondary | ICD-10-CM

## 2017-11-05 DIAGNOSIS — G43411 Hemiplegic migraine, intractable, with status migrainosus: Secondary | ICD-10-CM | POA: Diagnosis not present

## 2017-11-05 DIAGNOSIS — I10 Essential (primary) hypertension: Secondary | ICD-10-CM | POA: Diagnosis not present

## 2017-11-05 DIAGNOSIS — I5032 Chronic diastolic (congestive) heart failure: Secondary | ICD-10-CM

## 2017-11-05 DIAGNOSIS — R4189 Other symptoms and signs involving cognitive functions and awareness: Secondary | ICD-10-CM | POA: Diagnosis not present

## 2017-11-05 DIAGNOSIS — E876 Hypokalemia: Secondary | ICD-10-CM

## 2017-11-05 DIAGNOSIS — M6289 Other specified disorders of muscle: Secondary | ICD-10-CM | POA: Diagnosis not present

## 2017-11-05 LAB — LIPID PANEL
CHOLESTEROL: 131 mg/dL (ref 0–200)
HDL: 40 mg/dL — ABNORMAL LOW (ref >40)
LDL CALC: 76 mg/dL (ref 0–99)
Total CHOL/HDL Ratio: 3.3 RATIO
Triglycerides: 76 mg/dL (ref <150)
VLDL: 15 mg/dL (ref 0–40)

## 2017-11-05 LAB — HEMOGLOBIN A1C
Hgb A1c MFr Bld: 5.1 % (ref 4.8–5.6)
Mean Plasma Glucose: 99.67 mg/dL

## 2017-11-05 MED ORDER — POTASSIUM CHLORIDE CRYS ER 20 MEQ PO TBCR
40.0000 meq | EXTENDED_RELEASE_TABLET | Freq: Once | ORAL | Status: AC
  Administered 2017-11-05: 40 meq via ORAL
  Filled 2017-11-05: qty 2

## 2017-11-05 NOTE — Progress Notes (Signed)
PT informed nursing staff that patient, had another episode,( See PT note for details). MD to be notified.

## 2017-11-05 NOTE — Psychosocial Assessment (Signed)
Speech informed RN that patient had an episode ( blank stare and not responding to commands) this lasted for a few moments. Patient speech after epidsode was a little delayed,but responding appropirately. RN in to assessed and patient did have blank stare, gazing down at the floor, RN shook patient  called out his name, patient did respond. MD notified([place])

## 2017-11-05 NOTE — Progress Notes (Signed)
LTM started; nurse educated on event button. Dr Sass notified. 

## 2017-11-05 NOTE — Progress Notes (Addendum)
NEUROHOSPITALISTS - DAILY PROGRESS NOTE  S://  No family at bedside. Patient found laying in bed in NAD. Voice no new complaints/concerns. States his Left sided weakness has improved overnight but his LLE remains weaker than baseline strength. Questionable seizure activity reported by RN twice today. Described as staring spells and brief episodes of unresponsiveness. Telemetry reviewed by Dr [name]. No arrhthymias documented on telemetry monitoring during these episodes. No incontinence or tongue biting reported. Patient has received his Keppra last PM and this AM per RN.  O://   Physical Examination   Vitals:   11/04/17 2354 11/05/17 0403 11/05/17 0741 11/05/17 1222  BP: 103/66 104/63 116/71 123/72  Pulse: 76 82 66 76  Resp: 17 16 18 16   Temp: 98.5 F (36.9 C) 97.7 F (36.5 C) 97.8 F (36.6 C) 97.7 F (36.5 C)  TempSrc: Oral Oral Oral Oral  SpO2: 97% 100% 99% 96%  Weight:      Height:       HEENT-  Normocephalic,  Cardiovascular - Regular rate and rhythm  Respiratory - Lungs clear bilaterally. Non-labored breathing, No wheezing. Abdomen - soft and non-tender, BS normal Extremities- no edema or cyanosis Skin-warm and dry  Neurological Examination  Mental Status: Awake, slightly confused at times but easily reoriented, Oriented x 3. Speech fluent without evidence of aphasia or neglect.  Able to follow most commands without difficulty. Mentation likely at baseline today.  Cranial Nerves: II: Visual Fields are full in Right eye. 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 Pupils are equal, round, and reactive to light.  III,IV, VI: EOM intact without diploplia. No nystagmus. Left eye extrophia at baseline  V: Facial sensation is symmetric to temperature VII: Facial movement is symmetric.  VIII: hearing is intact to voice X: Uvula elevates symmetrically XI: Shoulder shrug is symmetric. XII: tongue is midline without  atrophy or fasciculations.  Motor: Tone is normal. Bulk is normal.  5/5 strength on Right. Initial testing demonstrated LUE weakness, which is a baseline finding according to EMR. Patient is hesitant to move Left leg due to back pain. LUE 4/5. Left hand grip 4+/5,  LLE 4/5 Sensor: Sensation is symmetric to light touch and temperature in the arms and legs. Deep Tendon Reflexes: 2+ and symmetric throughout in the biceps and patellae Cerebellar: slightly ataxic finger-to-nose on left Gait: no tested, No truncal ataxia  Laboratory Results  CBC: Recent Labs  Lab 11/04/17 1134 11/04/17 1137  WBC 5.3  --   HGB 11.8* 12.6*  HCT 36.9* 37.0*  MCV 76.9*  --   PLT 242  --    Basic Metabolic Panel: Recent Labs  Lab 11/04/17 1134 11/04/17 1137  NA 138 140  K 3.4* 3.3*  CL 103 102  CO2 23  --   GLUCOSE 84 84  BUN 12 13  CREATININE 1.23 1.20  CALCIUM 8.6*  --   MG 2.4  --    IONIZED CALCIUM = 0.97  Coagulation Studies: Recent Labs    11/04/17 1134  APTT 30  INR 1.19   Urine Drug Screen:      Component Value Date/Time   LABOPIA NONE DETECTED 11/04/2017 1523   COCAINSCRNUR NONE DETECTED 11/04/2017 1523   LABBENZ NONE DETECTED 11/04/2017 1523   AMPHETMU NONE DETECTED 11/04/2017 1523   THCU NONE DETECTED 11/04/2017 1523   LABBARB NONE DETECTED 11/04/2017 1523    Alcohol Level: No results for input(s): ETH in the last 168 hours.  Imaging Results  Ct Angio Head W Or Wo Contrast  Result Date: 11/04/2017 CLINICAL DATA:  Acute presentation with left hand weakness. EXAM: CT ANGIOGRAPHY HEAD AND NECK TECHNIQUE: Multidetector CT imaging of the head and neck was performed using the standard protocol during bolus administration of intravenous contrast. Multiplanar CT image reconstructions and MIPs were obtained to evaluate the vascular anatomy. Carotid stenosis measurements (when applicable) are obtained utilizing NASCET criteria, using the distal internal carotid diameter as the  denominator. CONTRAST:  50mL ISOVUE-370 IOPAMIDOL (ISOVUE-370) INJECTION 76% COMPARISON:  CT same day. 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 Mild degenerative cervical spondylosis. Other neck: No mass or lymphadenopathy. Upper chest: Normal except for the presence of a central venous catheter. Review of the MIP images confirms the above findings CTA HEAD FINDINGS Anterior circulation: Both internal carotid arteries are widely patent through the skull base and siphon regions. Left ring for some technical differences of the scanners, I think the intracranial vessels are normal. No stenosis or occlusion. Posterior circulation: Both vertebral arteries are patent to the basilar. No basilar stenosis. Posterior circulation branch vessels show flow. Right PCA takes a fetal origin from the right carotid circulation. As previously, there is slight motion degradation and some technical differentiation, but no changes suspected. Venous sinuses: Patent and normal. Anatomic variants: None significant Delayed phase: No abnormal enhancement. Review of the MIP images confirms the above findings IMPRESSION: Negative CT angiography of the neck and intracranial vasculature. Today's intracranial study suffers from mild motion degradation and slightly decreased resolution, but I think is unchanged from previous studies and within normal limits. Electronically Signed   By: Paulina Fusi M.D.   On: 11/04/2017 13:21   Ct Head Wo Contrast  Result Date: 11/05/2017 CLINICAL DATA:  Altered level of consciousness. Visual changes. Follow-up stroke. EXAM: CT HEAD WITHOUT CONTRAST TECHNIQUE: Contiguous axial images were obtained from the base of the skull through the vertex without intravenous contrast. COMPARISON:  11/04/2017 FINDINGS:  Brain: No evidence of infarction, hemorrhage, hydrocephalus, extra-axial collection or mass lesion/mass effect. Vascular: No hyperdense vessel or unexpected calcification. Skull: No acute or aggressive finding. Sinuses/Orbits: Left cataract resection and scleral band. Dysconjugate gaze, nonspecific in this patient with reported blind left thigh. IMPRESSION: Stable, negative head CT. Electronically Signed   By: Marnee Spring M.D.   On: 11/05/2017 13:23   Ct Angio Neck W Or Wo Contrast  Result Date: 11/04/2017 CLINICAL DATA:  Acute presentation with left hand weakness. EXAM: CT ANGIOGRAPHY HEAD AND NECK TECHNIQUE: Multidetector CT imaging of the head and neck was performed using the standard protocol during bolus administration of intravenous contrast. Multiplanar CT image reconstructions and MIPs were obtained to evaluate the vascular anatomy. Carotid stenosis measurements (when applicable) are obtained utilizing NASCET criteria, using the distal internal carotid diameter as the denominator. CONTRAST:  50mL ISOVUE-370 IOPAMIDOL (ISOVUE-370) INJECTION 76% COMPARISON:  CT same day. 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 Mild degenerative cervical spondylosis. Other neck: No mass or lymphadenopathy. Upper chest: Normal except for the presence of a central venous catheter. Review of the MIP images confirms the above findings CTA HEAD FINDINGS Anterior circulation: Both internal carotid arteries are widely patent through the skull base and siphon regions. Left ring for some technical differences of  the scanners, I think the intracranial vessels are normal. No stenosis or occlusion. Posterior circulation: Both vertebral arteries are patent to the basilar. No basilar stenosis. Posterior circulation branch vessels  show flow. Right PCA takes a fetal origin from the right carotid circulation. As previously, there is slight motion degradation and some technical differentiation, but no changes suspected. Venous sinuses: Patent and normal. Anatomic variants: None significant Delayed phase: No abnormal enhancement. Review of the MIP images confirms the above findings IMPRESSION: Negative CT angiography of the neck and intracranial vasculature. Today's intracranial study suffers from mild motion degradation and slightly decreased resolution, but I think is unchanged from previous studies and within normal limits. Electronically Signed   By: Paulina FusiMark  Shogry M.D.   On: 11/04/2017 13:21   Ct Head Code Stroke Wo Contrast  Result Date: 11/04/2017 CLINICAL DATA:  Code stroke.  Loss of left grip.  Confusion. EXAM: CT HEAD WITHOUT CONTRAST TECHNIQUE: Contiguous axial images were obtained from the base of the skull through the vertex without intravenous contrast. COMPARISON:  08/29/2017 FINDINGS: Brain: No evidence of infarction, hemorrhage, hydrocephalus, extra-axial collection or mass lesion/mass effect. Vascular: No hyperdense vessel or unexpected calcification. Skull: Normal. Negative for fracture or focal lesion. Sinuses/Orbits: Cataract resection and scleral band on the left. Nonspecific dysconjugate gaze. Other: These results were communicated to Dr. Laurence SlateAroor at 12:00 pmon 3/29/2019by text page via the [facility] messaging system. ASPECTS [facility]([place] Stroke Program Early CT Score) - Ganglionic level infarction (caudate, lentiform nuclei, internal capsule, insula, M1-M3 cortex): 7 - Supraganglionic infarction (M4-M6 cortex): 3 Total score (0-10 with 10 being normal): 10 IMPRESSION: Negative head CT.  ASPECTS is 10. Electronically Signed   By: Marnee SpringJonathon  Watts M.D.   On: 11/04/2017 12:01   IMPRESSION AND PLAN  Mr. Quintella ReichertMichael T Viviani is a 42 y.o. 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  Imaging thus far negative for acute findings.  Staring spells, LOC Seizure vs Syncope vs Symptomatic Atrial Tachycardia  Two episodes of seizure-like activity reported today characterized by staring spells with LOC. No arrhythmias reported since yesterday.Monitor reveals norma sinus rhythm  Left sided weakness improved on exam today but not at baseline strength. Denies H/A today.  Plan  Repeat CT head shows no acute stroke  Continue Keppra 750 mg BID LTM EEG today for starring spells Maintain Seizure precautions Frequent Neurochecks  Telemetry Monitoring Continue Aspirin/ Xarelto/ Statin No need for ECHO at this time PT/OT/SLP Consult Case Management /MSW Ongoing aggressive stroke risk factor management Patient will be counseled to be compliant with his antithrombotic medications Patient will be counseled on Lifestyle modifications including, Diet, Exercise, and Stress Follow up with [facility] in 6 weeks  AFIB, Chronic: Continue Xarelto Pacemaker interrogation -  >130 recent episodes of atrial tachyarrhythmias, rates 140s to 180s, lasting up to 1.5 minutes.  Hypocalcemia  IV calcium gluconate x 1 Magnesium- WNL Management per primary team  HYPERTENSION: Stable Long term BP goal normotensive. Home Meds: None  Atrial tachycardia  Beryl MeagerMary A Costello, ANP-C [facility] 11/05/2017, 3:05 PM  11/05/2017 ATTENDING ASSESSMENT   Had 2 staring spells, also Dr Maryfrances Bunnellanford noticed patient was signficantly weak on left side today, however Ms. Costello, ARNP and myself patient was atleast 4/5 strength in LLE.  Prior episodes of left side weakness.  Will get 24hr EEG to see if we can capture spells.   I have reviewed the history, exam and agree with document of ARNP, I also discussed  the plan and agree with plan documented above.   Will follow.   Georgiana Spinner Aroor MD [facility] [phone]  If 7pm to 7am, please call on call as listed on AMION.  Please page stroke NP/ PA / MD from 8am -4 pm   You can look them up on [email or web address]  Password [email or web address]

## 2017-11-05 NOTE — Progress Notes (Signed)
PROGRESS NOTE    Richard Benson  ZOX:[id number] DOB: 10-01-1975 DOA: 11/04/2017 PCP: Corwin Levins, MD      Brief Narrative:  Richard Benson is a 42 y.o. M with Afib with pacer on Xarelto, CHF 50%, HTN and blindness who presents with staring spells.  "Per EMS patient was at his pain clinic appointment today when he had 2-3 episodes of brief unresponsiveness. EMS arrived to the office and noted intermittent unresponsiveness, described as syncopal, with no arryhmias or apnic events noted on monitoring. Then per EMS reports, patient became acutely weak on his left side while in transport to the hospital. On arrival to [facility] patient remains weak on Left side. Complaining of a Right sided H/A. Initial Head CT negative for acute findings. Two, brief, witnessed episodes, lasting a few seconds, of unresponsiveness on examination in ED.  No reports of seizure activity, incontinence or tongue biting.  Patient states his last dose of Xarelto was Tuesday of this week. He says he ran out and needs a refill. When asked about his seizure medication Keppra, he states his PCP discontinued this medication."    Assessment & Plan:  Staring spells: TIA vs symptomatic tachyarrhythmia vs malingering Patient recently stopped Keppra.  Also, per pacer, he has been having SVTs on pacer interrogation.  In the hospital here, he has had numerous staring spells observed by nursing and PT/OT.  During these episodes he is unresponsive and stares, but has no GTC acitivity and does not lose tone.  His monitoring reveals NSR during all episodes.  Repeat CT of his head reveals no signs of evolving stroke.  MRI is not possible with his pacer. -Consult to Neurology -Plan for cEEG  Atrial fibrillation Pacemaker in situ CHADS2Vasc 2.  On Xarelto, nonadherent. -Continue Xarelto  History of seizures -Continue Keppra -Continue gabapentin, Topamax  Chronic diastolic CHF Hypertnsion BP normal.  EF 50%.  Appears  euvolemic -Continue aspirin, statin -Hold home torsemide for now  Depression -Continue SSRI  Hypokalemia -Replete      DVT prophylaxis: N/A Code Status: FULL Family Communication: None present MDM and disposition Plan: The below labs and imaging reports were reviewed.  The patient's status is clinically unchanged.  He contineus to have unexplained spells.  These may be seizures, or they may be malingering.  The above medication chnages were made, further workup is planned.     Consultants:   Neurology  Procedures:   CT head 3/29 IMPRESSION: Negative head CT.  ASPECTS is 10.    CTA head and neck 3/29 IMPRESSION: Negative CT angiography of the neck and intracranial vasculature.  Today's intracranial study suffers from mild motion degradation and slightly decreased resolution, but I think is unchanged from previous studies and within normal limits.    CT head 3/30 IMPRESSION: Stable, negative head CT.    Antimicrobials:   None    Subjective: Feels fine.  No change.  No confusion.  No fever, cough, sputum, dysuria.  Has numbness in left arm.  No diarrhea.  No palpitations, chest pain, leg swelling.  Objective: Vitals:   11/05/17 0403 11/05/17 0741 11/05/17 1222 11/05/17 1547  BP: 104/63 116/71 123/72 118/69  Pulse: 82 66 76 75  Resp: 16 18 16 18   Temp: 97.7 F (36.5 C) 97.8 F (36.6 C) 97.7 F (36.5 C) 98 F (36.7 C)  TempSrc: Oral Oral Oral Oral  SpO2: 100% 99% 96% 97%  Weight:      Height:       No  intake or output data in the 24 hours ending 11/05/17 1625 Filed Weights   11/04/17 1154 11/04/17 2129  Weight: 118.1 kg (260 lb 5.8 oz) 115.3 kg (254 lb 3.1 oz)    Examination: General appearance:  adult male, alert and in no acute distress.  Lying in bed. HEENT: Exotropia chronic.  Anicteric, conjunctiva pink, lids and lashes normal. No nasal deformity, discharge, epistaxis.  Lips moist.  Dentition poor, OP without lesions.   Skin: Warm  and dry.  No jaundice.  Numerous hyperpigmentations from old scars, but no lesions. Cardiac: RRR, nl S1-S2, no murmurs appreciated.  Capillary refill is brisk.  JVP normal.  No LE edema.  Respiratory: Normal respiratory rate and rhythm.  CTAB without rales or wheezes. Abdomen: Abdomen soft.  No TTP. No ascites, distension, hepatosplenomegaly.   MSK: No deformities or effusions. Neuro: Awake and alert.  Cranial nerves norma other than baseline exotropia.Moves all extremities, poor effort vs weak on left arm. Speech fluent.    Psych: Sensorium intact and responding to questions, attention normal. Affect blunted.  Judgment and insight appear norma.    Data Reviewed: I have personally reviewed following labs and imaging studies:  CBC: Recent Labs  Lab 11/04/17 1134 11/04/17 1137  WBC 5.3  --   NEUTROABS 2.0  --   HGB 11.8* 12.6*  HCT 36.9* 37.0*  MCV 76.9*  --   PLT 242  --    Basic Metabolic Panel: Recent Labs  Lab 11/04/17 1134 11/04/17 1137  NA 138 140  K 3.4* 3.3*  CL 103 102  CO2 23  --   GLUCOSE 84 84  BUN 12 13  CREATININE 1.23 1.20  CALCIUM 8.6*  --   MG 2.4  --    GFR: Estimated Creatinine Clearance: 102.5 mL/min (by C-G formula based on SCr of 1.2 mg/dL). Liver Function Tests: Recent Labs  Lab 11/04/17 1134  AST 21  ALT 13*  ALKPHOS 59  BILITOT 0.7  PROT 6.8  ALBUMIN 4.0   No results for input(s): LIPASE, AMYLASE in the last 168 hours. No results for input(s): AMMONIA in the last 168 hours. Coagulation Profile: Recent Labs  Lab 11/04/17 1134  INR 1.19   Cardiac Enzymes: Recent Labs  Lab 11/04/17 1653  TROPONINI <0.03   BNP (last 3 results) No results for input(s): PROBNP in the last 8760 hours. HbA1C: Recent Labs    11/04/17 1154 11/05/17 0629  HGBA1C 5.2 5.1   CBG: Recent Labs  Lab 11/04/17 1519  GLUCAP 89   Lipid Profile: Recent Labs    11/04/17 1230 11/05/17 0629  CHOL 139 131  HDL 45 40*  LDLCALC 80 76  TRIG 69 76    CHOLHDL 3.1 3.3   Thyroid Function Tests: No results for input(s): TSH, T4TOTAL, FREET4, T3FREE, THYROIDAB in the last 72 hours. Anemia Panel: No results for input(s): VITAMINB12, FOLATE, FERRITIN, TIBC, IRON, RETICCTPCT in the last 72 hours. Urine analysis:    Component Value Date/Time   COLORURINE YELLOW 07/07/2011 1708   APPEARANCEUR CLEAR 07/07/2011 1708   LABSPEC 1.028 07/07/2011 1708   PHURINE 6.5 07/07/2011 1708   GLUCOSEU NEGATIVE 07/07/2011 1708   GLUCOSEU NEGATIVE 08/01/2007 1617   HGBUR NEGATIVE 07/07/2011 1708   BILIRUBINUR NEGATIVE 07/07/2011 1708   KETONESUR NEGATIVE 07/07/2011 1708   PROTEINUR NEGATIVE 07/07/2011 1708   UROBILINOGEN 1.0 07/07/2011 1708   NITRITE NEGATIVE 07/07/2011 1708   LEUKOCYTESUR NEGATIVE 07/07/2011 1708   Sepsis Labs: @LABRCNTIP (procalcitonin:4,lacticacidven:4)  )No results found for this  or any previous visit (from the past 240 hour(s)).       Radiology Studies: Ct Angio Head W Or Wo Contrast  Result Date: 11/04/2017 CLINICAL DATA:  Acute presentation with left hand weakness. EXAM: CT ANGIOGRAPHY HEAD AND NECK TECHNIQUE: Multidetector CT imaging of the head and neck was performed using the standard protocol during bolus administration of intravenous contrast. Multiplanar CT image reconstructions and MIPs were obtained to evaluate the vascular anatomy. Carotid stenosis measurements (when applicable) are obtained utilizing NASCET criteria, using the distal internal carotid diameter as the denominator. CONTRAST:  50mL ISOVUE-370 IOPAMIDOL (ISOVUE-370) INJECTION 76% COMPARISON:  CT same day. 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 Mild degenerative cervical spondylosis. Other neck: No mass or lymphadenopathy. Upper chest: Normal except for the  presence of a central venous catheter. Review of the MIP images confirms the above findings CTA HEAD FINDINGS Anterior circulation: Both internal carotid arteries are widely patent through the skull base and siphon regions. Left ring for some technical differences of the scanners, I think the intracranial vessels are normal. No stenosis or occlusion. Posterior circulation: Both vertebral arteries are patent to the basilar. No basilar stenosis. Posterior circulation branch vessels show flow. Right PCA takes a fetal origin from the right carotid circulation. As previously, there is slight motion degradation and some technical differentiation, but no changes suspected. Venous sinuses: Patent and normal. Anatomic variants: None significant Delayed phase: No abnormal enhancement. Review of the MIP images confirms the above findings IMPRESSION: Negative CT angiography of the neck and intracranial vasculature. Today's intracranial study suffers from mild motion degradation and slightly decreased resolution, but I think is unchanged from previous studies and within normal limits. Electronically Signed   By: Paulina Fusi M.D.   On: 11/04/2017 13:21   Ct Head Wo Contrast  Result Date: 11/05/2017 CLINICAL DATA:  Altered level of consciousness. Visual changes. Follow-up stroke. EXAM: CT HEAD WITHOUT CONTRAST TECHNIQUE: Contiguous axial images were obtained from the base of the skull through the vertex without intravenous contrast. COMPARISON:  11/04/2017 FINDINGS: Brain: No evidence of infarction, hemorrhage, hydrocephalus, extra-axial collection or mass lesion/mass effect. Vascular: No hyperdense vessel or unexpected calcification. Skull: No acute or aggressive finding. Sinuses/Orbits: Left cataract resection and scleral band. Dysconjugate gaze, nonspecific in this patient with reported blind left thigh. IMPRESSION: Stable, negative head CT. Electronically Signed   By: Marnee Spring M.D.   On: 11/05/2017 13:23   Ct  Angio Neck W Or Wo Contrast  Result Date: 11/04/2017 CLINICAL DATA:  Acute presentation with left hand weakness. EXAM: CT ANGIOGRAPHY HEAD AND NECK TECHNIQUE: Multidetector CT imaging of the head and neck was performed using the standard protocol during bolus administration of intravenous contrast. Multiplanar CT image reconstructions and MIPs were obtained to evaluate the vascular anatomy. Carotid stenosis measurements (when applicable) are obtained utilizing NASCET criteria, using the distal internal carotid diameter as the denominator. CONTRAST:  50mL ISOVUE-370 IOPAMIDOL (ISOVUE-370) INJECTION 76% COMPARISON:  CT same day. 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 Mild degenerative cervical spondylosis. Other neck: No mass or lymphadenopathy. Upper chest: Normal except for the presence of a central venous catheter. Review of the MIP images confirms the above findings CTA  HEAD FINDINGS Anterior circulation: Both internal carotid arteries are widely patent through the skull base and siphon regions. Left ring for some technical differences of the scanners, I think the intracranial vessels are normal. No stenosis or occlusion. Posterior circulation: Both vertebral arteries are patent to the basilar. No basilar stenosis. Posterior circulation branch vessels show flow. Right PCA takes a fetal origin from the right carotid circulation. As previously, there is slight motion degradation and some technical differentiation, but no changes suspected. Venous sinuses: Patent and normal. Anatomic variants: None significant Delayed phase: No abnormal enhancement. Review of the MIP images confirms the above findings IMPRESSION: Negative CT angiography of the neck and intracranial vasculature. Today's intracranial study suffers  from mild motion degradation and slightly decreased resolution, but I think is unchanged from previous studies and within normal limits. Electronically Signed   By: Paulina FusiMark  Shogry M.D.   On: 11/04/2017 13:21   Ct Head Code Stroke Wo Contrast  Result Date: 11/04/2017 CLINICAL DATA:  Code stroke.  Loss of left grip.  Confusion. EXAM: CT HEAD WITHOUT CONTRAST TECHNIQUE: Contiguous axial images were obtained from the base of the skull through the vertex without intravenous contrast. COMPARISON:  08/29/2017 FINDINGS: Brain: No evidence of infarction, hemorrhage, hydrocephalus, extra-axial collection or mass lesion/mass effect. Vascular: No hyperdense vessel or unexpected calcification. Skull: Normal. Negative for fracture or focal lesion. Sinuses/Orbits: Cataract resection and scleral band on the left. Nonspecific dysconjugate gaze. Other: These results were communicated to Dr. Laurence SlateAroor at 12:00 pmon 3/29/2019by text page via the [facility] messaging system. ASPECTS [facility]([place] Stroke Program Early CT Score) - Ganglionic level infarction (caudate, lentiform nuclei, internal capsule, insula, M1-M3 cortex): 7 - Supraganglionic infarction (M4-M6 cortex): 3 Total score (0-10 with 10 being normal): 10 IMPRESSION: Negative head CT.  ASPECTS is 10. Electronically Signed   By: Marnee SpringJonathon  Watts M.D.   On: 11/04/2017 12:01        Scheduled Meds: .  stroke: mapping our early stages of recovery book   Does not apply Once  . aspirin EC  81 mg Oral Daily  . atorvastatin  40 mg Oral q1800  . citalopram  20 mg Oral Daily  . gabapentin  200 mg Oral BID  . levETIRAcetam  750 mg Oral BID  . pantoprazole  40 mg Oral BID  . rivaroxaban  20 mg Oral Q supper  . topiramate  25 mg Oral BID   Continuous Infusions: . sodium chloride       LOS: 0 days    Time spent: 45 minutes    Alberteen Samhristopher P Danford, MD [facility] 11/05/2017, 4:25 PM     Pager [phone] --- please page though AMION:  [email or web address] Password  [email or web address] If 7PM-7AM, please contact night-coverage

## 2017-11-05 NOTE — Progress Notes (Signed)
Pt admitted from ED; Alert and oriented; VSS: telemetry applied and verified with CCMD; NT called to second verify; pt oriented to the unit and room; fall/safety precaution and prevention education completed with pt; skin intact with no opened wounds or pressure ulcer noted; call light within reach; bed alarm on; will closely monitor. Dionne BucyP. Amo Semaj Coburn RN

## 2017-11-05 NOTE — Progress Notes (Signed)
Talked to [employer] rep.  Pacer shows >130 recent episodes of atrial tachyarrhythmias.  All reported as 1:1 AV conduction, but rates 140s to 180s.  Most recent was yesterday morning, 1 1/2 minutes.  Tracings in chart.

## 2017-11-05 NOTE — Discharge Summary (Signed)
Physician Discharge Summary  Richard Benson UJW:[id number] DOB: 04/10/1976 DOA: 11/04/2017  PCP: Richard LevinsJohn, Richard W, MD  Admit date: 11/04/2017 Discharge date: 11/06/2017  Admitted From: Home  Disposition:  Home   Recommendations for Outpatient Follow-up:  1. Follow up with PCP in 1-2 weeks 2. Follow up with Cardiology in 4-6 weeks 3. Follow up with [facility] in 4-6 weeks   Home Health: Yes  Equipment/Devices: None  Discharge Condition: Good  CODE STATUS: FULL Diet recommendation: Cardiac  Brief/Interim Summary: [name] is a 42 y.o. M with Afib with pacer on Xarelto, CHF 50%, HTN and blindness who presents with staring spells.  Per EMS, the patient was at his pain clinic when he had several episodes of brief unresponsiveness.  This was called "syncope" although there was no report of loss of tone or fall, and he was sent to the ER.  Per EMS, he became acutely weak on left side while in transport.  On arrival to the ER, he remained weak on the left side and had right sided HA.  Initial head CT negative for stroke, mass, bleeding.  Two brief witnessed episodes of unresponsiveness occurred in the ED, without evidence of generalized tonic clonic activity.  He had previously been on Keppra, which was discontinued by his PCP.       Staring spells Absence seizures vs malingering vs conversion disorder [facility], [facility]  On admission, found to have only a hemiplegic migraine again (CT repeat at 24 hrs normal).    In the [facility] here, patient had numerous staring spells observed by nursing and PT/OT.  During these episodes he was unresponsive and stares, but has no GTC acitivity and does not lose tone.  His monitoring revealed NSR during all episodes.  Repeat CT of his head revealed no signs of evolving stroke.  MRI is not possible with his pacer.    cEEG was obtained and showed no  abnormal activity.  He was started on Keppra by Neurology, and scheduled for follow up.     Atrial fibrillation Pacemaker in situ CHADS2Vasc 2.  On Xarelto, nonadherent.  His pacer was interrogated and showed over 100 episodes of regular narrow complex tachycardia in the last several months, rates between 140s and 180s.  These tracings were reviewed and could not tell if they were sinus tachycardia or other SVT.  Of note, at his last device check with Richard LawrenceStrag PA-C in Dec 2018, there is mention of "occasional runs of what appear to be sinus tach vs. atach (more likely the former)" which suggests to me this is not a new finding. -Xarelto was continued.   -This report is faxed to primary Cardiologist Dr. Dulce Benson and patient counseled to schedule new follow up.  No previous history of seizures No previous history of seizures.  He was started on Keppra.  He has follow up with [facility] in 6 weeks.  His last Neurologist was Dr. Hyacinth [facility] in 2017 for hemiplegic Migraines.   -Continue new Keppra -F/u with [facility] 6 weeks  History of hemiplegic migraines Likely this contributed as well. -Resume Topamax, which his pharmacy stated he was not taking  Chronic diastolic CHF Hypertnsion Home medicines resumed at discharge.  Depression SSRI continued, counseled to follow up with behavioral health referral [facility] from January.  Hypokalemia Repleted       Discharge Diagnoses:  Principal Problem:   Stroke [facility](HCC) Active Problems:  OBESITY, MORBID   Anxiety state   Migraine headache   LOW BACK PAIN   SAH (subarachnoid hemorrhage) (HCC)   Blind left eye    Discharge Instructions  Discharge Instructions    Diet - low sodium heart healthy   Complete by:  As directed    Discharge instructions   Complete by:  As directed    From Dr. Maryfrances Benson: You were admitted for staring spells, that we were concerned were from either seizures or abnormal heart rhythms.  While you  were here, you had several of the episodes, and your heart rhythm was normal, so we have ruled out that these episodes were caused by something wrong with your heart.    We also repeated your head CT scan, and we ruled out that the episodes were from a stroke.  We attempted to get a continuous EEG overnight to watch for the episodes, but didn't see them, although you didn't have any of the episodes while the monitor was on.  With that in mind, our Neurologists feel it cannot be entirely ruled out that these episodes are mini-seizures, and so they recommend you start taking Keppra 750 mg twice daily, which is a seizure medicine. Call [facility] [phone] after you leave the [facility] to schedule a follow up appointment in 4-6 weeks so that you can discuss with them whether to conitnue the medicine or not. DO NOT DRIVE FOR 6 MONTHS FROM TODAY, this is state law after a seizure.   Also while you were here, the pacemaker rep interrogated your pacemaker and found that it had detected several episodes of very fast heart rates over the last several months.  These were not related to your spells, and happen several times per day, but I have forwarded my discharge summary to Dr. Dulce Sellar so he knows and I would ask you discuss with him if this is a new finding or not.   Restart your home blood pressure medicines  Restart the new Topiramate/Topamax that they prescribed in [facility] in January for your migraines.  Follow up with your primary care doctor in 1 week, call them for an appointment.  Call Dr. Hulen Benson office for an appointment in the next month about your pacer and the fast heart rates.   Increase activity slowly   Complete by:  As directed      Allergies as of 11/06/2017   No Known Allergies     Medication List    TAKE these medications   acetaminophen 500 MG tablet Commonly known as:  TYLENOL Take 500-1,000 mg by mouth every 6 (six) hours as needed (for  pain).   aspirin EC 81 MG tablet Take 1 tablet (81 mg total) by mouth daily.   atorvastatin 40 MG tablet Commonly known as:  LIPITOR Take 1 tablet (40 mg total) by mouth daily at 6 PM.   celecoxib 200 MG capsule Commonly known as:  CELEBREX Take 200 mg by mouth 2 (two) times daily.   citalopram 20 MG tablet Commonly known as:  CELEXA Take 20 mg by mouth daily.   gabapentin 100 MG capsule Commonly known as:  NEURONTIN Take 200 mg by mouth 3 (three) times daily.   levETIRAcetam 750 MG tablet Commonly known as:  KEPPRA Take 1 tablet (750 mg total) by mouth 2 (two) times daily.   multivitamin with minerals Tabs tablet Take 1 tablet by mouth daily.   nitroGLYCERIN 0.4 MG SL tablet Commonly known as:  NITROSTAT Place 0.4  mg under the tongue every 5 (five) minutes as needed for chest pain.   oxyCODONE-acetaminophen 10-325 MG tablet Commonly known as:  PERCOCET Take 1 tablet by mouth every 6 (six) hours as needed for pain.   pantoprazole 40 MG tablet Commonly known as:  PROTONIX Take 40 mg by mouth 2 (two) times daily.   rivaroxaban 20 MG Tabs tablet Commonly known as:  XARELTO Take 1 tablet (20 mg total) by mouth daily with supper.   topiramate 25 MG tablet Commonly known as:  TOPAMAX Take 25 mg by mouth 2 (two) times daily.   torsemide 20 MG tablet Commonly known as:  DEMADEX Take 20 mg by mouth See admin instructions. Take 20 mg by mouth every other day "as needed" for swelling   VITAMIN B-12 PO Take 1 tablet by mouth daily.   Vitamin D3 5000 units Caps Take 5,000 Units by mouth daily.   VOLTAREN 1 % Gel Generic drug:  diclofenac sodium Apply 2 g topically See admin instructions. Apply 2 grams to affected areas four times a day   XTAMPZA ER 18 MG C12a Generic drug:  oxyCODONE ER Take 18 mg by mouth every 12 (twelve) hours.       No Known Allergies  Consultations:  Neurology   Procedures/Studies: Ct Angio Head [facility] Or Wo Contrast  Result Date:  11/04/2017 CLINICAL DATA:  Acute presentation with left hand weakness. EXAM: CT ANGIOGRAPHY HEAD AND NECK TECHNIQUE: Multidetector CT imaging of the head and neck was performed using the standard protocol during bolus administration of intravenous contrast. Multiplanar CT image reconstructions and MIPs were obtained to evaluate the vascular anatomy. Carotid stenosis measurements (when applicable) are obtained utilizing NASCET criteria, using the distal internal carotid diameter as the denominator. CONTRAST:  50mL ISOVUE-370 IOPAMIDOL (ISOVUE-370) INJECTION 76% COMPARISON:  CT same day. 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 Mild degenerative cervical spondylosis. Other neck: No mass or lymphadenopathy. Upper chest: Normal except for the presence of a central venous catheter. Review of the MIP images confirms the above findings CTA HEAD FINDINGS Anterior circulation: Both internal carotid arteries are widely patent through the skull base and siphon regions. Left ring for some technical differences of the scanners, I think the intracranial vessels are normal. No stenosis or occlusion. Posterior circulation: Both vertebral arteries are patent to the basilar. No basilar stenosis. Posterior circulation branch vessels show flow. Right PCA takes a fetal origin from the right carotid circulation. As previously, there is slight motion degradation and some technical differentiation, but no changes suspected. Venous sinuses: Patent and normal. Anatomic variants: None significant Delayed phase: No abnormal enhancement. Review of the MIP images confirms the above findings IMPRESSION: Negative CT angiography of the neck and intracranial vasculature. Today's intracranial study suffers from mild motion degradation and slightly  decreased resolution, but I think is unchanged from previous studies and within normal limits. Electronically Signed   By: Paulina Fusi M.D.   On: 11/04/2017 13:21   Ct Head Wo Contrast  Result Date: 11/05/2017 CLINICAL DATA:  Altered level of consciousness. Visual changes. Follow-up stroke. EXAM: CT HEAD WITHOUT CONTRAST TECHNIQUE: Contiguous axial images were obtained from the base of the skull through the vertex without intravenous contrast. COMPARISON:  11/04/2017 FINDINGS: Brain: No evidence of infarction, hemorrhage, hydrocephalus, extra-axial collection or mass lesion/mass effect. Vascular: No hyperdense vessel or unexpected  calcification. Skull: No acute or aggressive finding. Sinuses/Orbits: Left cataract resection and scleral band. Dysconjugate gaze, nonspecific in this patient with reported blind left thigh. IMPRESSION: Stable, negative head CT. Electronically Signed   By: Marnee Spring M.D.   On: 11/05/2017 13:23   Ct Angio Neck Benson Or Wo Contrast  Result Date: 11/04/2017 CLINICAL DATA:  Acute presentation with left hand weakness. EXAM: CT ANGIOGRAPHY HEAD AND NECK TECHNIQUE: Multidetector CT imaging of the head and neck was performed using the standard protocol during bolus administration of intravenous contrast. Multiplanar CT image reconstructions and MIPs were obtained to evaluate the vascular anatomy. Carotid stenosis measurements (when applicable) are obtained utilizing NASCET criteria, using the distal internal carotid diameter as the denominator. CONTRAST:  50mL ISOVUE-370 IOPAMIDOL (ISOVUE-370) INJECTION 76% COMPARISON:  CT same day. 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 Mild degenerative cervical spondylosis. Other neck: No mass or lymphadenopathy. Upper chest: Normal except for the  presence of a central venous catheter. Review of the MIP images confirms the above findings CTA HEAD FINDINGS Anterior circulation: Both internal carotid arteries are widely patent through the skull base and siphon regions. Left ring for some technical differences of the scanners, I think the intracranial vessels are normal. No stenosis or occlusion. Posterior circulation: Both vertebral arteries are patent to the basilar. No basilar stenosis. Posterior circulation branch vessels show flow. Right PCA takes a fetal origin from the right carotid circulation. As previously, there is slight motion degradation and some technical differentiation, but no changes suspected. Venous sinuses: Patent and normal. Anatomic variants: None significant Delayed phase: No abnormal enhancement. Review of the MIP images confirms the above findings IMPRESSION: Negative CT angiography of the neck and intracranial vasculature. Today's intracranial study suffers from mild motion degradation and slightly decreased resolution, but I think is unchanged from previous studies and within normal limits. Electronically Signed   By: Paulina Fusi M.D.   On: 11/04/2017 13:21   Ct Head Code Stroke Wo Contrast  Result Date: 11/04/2017 CLINICAL DATA:  Code stroke.  Loss of left grip.  Confusion. EXAM: CT HEAD WITHOUT CONTRAST TECHNIQUE: Contiguous axial images were obtained from the base of the skull through the vertex without intravenous contrast. COMPARISON:  08/29/2017 FINDINGS: Brain: No evidence of infarction, hemorrhage, hydrocephalus, extra-axial collection or mass lesion/mass effect. Vascular: No hyperdense vessel or unexpected calcification. Skull: Normal. Negative for fracture or focal lesion. Sinuses/Orbits: Cataract resection and scleral band on the left. Nonspecific dysconjugate gaze. Other: These results were communicated to Dr. Laurence Slate at 12:00 pmon 3/29/2019by text page via the [facility] messaging system. ASPECTS [facility] Stroke Program  Early CT Score) - Ganglionic level infarction (caudate, lentiform nuclei, internal capsule, insula, M1-M3 cortex): 7 - Supraganglionic infarction (M4-M6 cortex): 3 Total score (0-10 with 10 being normal): 10 IMPRESSION: Negative head CT.  ASPECTS is 10. Electronically Signed   By: Marnee Spring M.D.   On: 11/04/2017 12:01  continuous EEG HISTORY: Continuous video-EEG monitoring performed for 42 year old with left-sided weakness, episodes of decreased responsiveness.  ACQUISITION: International 10-20 system for electrode placement; 18 channels with additional eyes linked to ipsilateral ears and EKG. Additional T1-T2 electrodes were used. Continuous video recording obtained.   EEG NUMBER:  MEDICATIONS:  Day 1:  LEV, TPM  DAY #1: from 1704 11/05/17 to  0730 11/06/17  BACKGROUND: An overall medium voltage  continuous recording with good spontaneous variability and reactivity. Waking background consisted of a medium voltage 9 Hz posterior dominant rhythm bilaterally with low voltage beta activity in the bilateral frontocentral regions and some medium voltage theta activity diffusely. Sleep was captured with normal stage II sleep architecture.  EPILEPTIFORM/PERIODIC ACTIVITY: none SEIZURES: none EVENTS: none reported  EKG: no significant arrhythmia  SUMMARY: This was a normal continuous video EEG with no epileptiform discharges or lateralizing signs.       Subjective: Feels well.  Mild headache, from the EEG leadas.  No confusion, lateral weakness, no numbness, no slurred speech.  No vomiting, diarrhea, fever.  Discharge Exam: Vitals:   11/06/17 0400 11/06/17 0843  BP: 103/67 (!) 107/58  Pulse: 65 79  Resp: 15 17  Temp: 97.7 F (36.5 C) 97.7 F (36.5 C)  SpO2: 100% 96%   Vitals:   11/05/17 2036 11/05/17 2355 11/06/17 0400 11/06/17 0843  BP: 98/62 103/61 103/67 (!) 107/58  Pulse: 68 73 65 79  Resp: 18 18 15 17   Temp: 97.9 F (36.6 C) (!) 97.4 F (36.3 C) 97.7 F (36.5 C)  97.7 F (36.5 C)  TempSrc: Oral Oral Oral Oral  SpO2: 97% 98% 100% 96%  Weight:      Height:        General: Pt is alert, awake, not in acute distress, lying in bed, interactive Cardiovascular: RRR, S1/S2 +, no rubs, no gallops Respiratory: CTA bilaterally, no wheezing, no rhonchi Abdominal: Soft, NT, ND, bowel sounds + Extremities: no edema, no cyanosis    The results of significant diagnostics from this hospitalization (including imaging, microbiology, ancillary and laboratory) are listed below for reference.     Microbiology: No results found for this or any previous visit (from the past 240 hour(s)).   Labs: BNP (last 3 results) No results for input(s): BNP in the last 8760 hours. Basic Metabolic Panel: Recent Labs  Lab 11/04/17 1134 11/04/17 1137 11/06/17 0323  NA 138 140 139  K 3.4* 3.3* 4.1  CL 103 102 109  CO2 23  --  24  GLUCOSE 84 84 87  BUN 12 13 15   CREATININE 1.23 1.20 0.82  CALCIUM 8.6*  --  8.3*  MG 2.4  --   --    Liver Function Tests: Recent Labs  Lab 11/04/17 1134  AST 21  ALT 13*  ALKPHOS 59  BILITOT 0.7  PROT 6.8  ALBUMIN 4.0   No results for input(s): LIPASE, AMYLASE in the last 168 hours. No results for input(s): AMMONIA in the last 168 hours. CBC: Recent Labs  Lab 11/04/17 1134 11/04/17 1137  WBC 5.3  --   NEUTROABS 2.0  --   HGB 11.8* 12.6*  HCT 36.9* 37.0*  MCV 76.9*  --   PLT 242  --    Cardiac Enzymes: Recent Labs  Lab 11/04/17 1653  TROPONINI <0.03   BNP: Invalid input(s): POCBNP CBG: Recent Labs  Lab 11/04/17 1519  GLUCAP 89   D-Dimer No results for input(s): DDIMER in the last 72 hours. Hgb A1c Recent Labs    11/04/17 1154 11/05/17 0629  HGBA1C 5.2 5.1   Lipid Profile Recent Labs    11/04/17 1230 11/05/17 0629  CHOL 139 131  HDL 45 40*  LDLCALC 80 76  TRIG 69 76  CHOLHDL 3.1 3.3   Thyroid function studies No results for input(s): TSH, T4TOTAL, T3FREE, THYROIDAB in the last 72  hours.  Invalid input(s): FREET3 Anemia work up No results for  input(s): VITAMINB12, FOLATE, FERRITIN, TIBC, IRON, RETICCTPCT in the last 72 hours. Urinalysis    Component Value Date/Time   COLORURINE YELLOW 07/07/2011 1708   APPEARANCEUR CLEAR 07/07/2011 1708   LABSPEC 1.028 07/07/2011 1708   PHURINE 6.5 07/07/2011 1708   GLUCOSEU NEGATIVE 07/07/2011 1708   GLUCOSEU NEGATIVE 08/01/2007 1617   HGBUR NEGATIVE 07/07/2011 1708   BILIRUBINUR NEGATIVE 07/07/2011 1708   KETONESUR NEGATIVE 07/07/2011 1708   PROTEINUR NEGATIVE 07/07/2011 1708   UROBILINOGEN 1.0 07/07/2011 1708   NITRITE NEGATIVE 07/07/2011 1708   LEUKOCYTESUR NEGATIVE 07/07/2011 1708   Sepsis Labs Invalid input(s): PROCALCITONIN,  WBC,  LACTICIDVEN Microbiology No results found for this or any previous visit (from the past 240 hour(s)).   Time coordinating discharge: 40 minutes  SIGNED:   Alberteen Sam, MD  [facility] 11/06/2017, 10:52 AM

## 2017-11-05 NOTE — Progress Notes (Signed)
CT called about patient appt, CT informed RN that transport would be on the way
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
## 2017-11-05 NOTE — Evaluation (Signed)
Speech Language Pathology Evaluation Patient Details Name: Richard ReichertMichael T Benson MRN: [id number] DOB: 02/12/1976 Today's Date: 11/05/2017 Time: [id number] SLP Time Calculation (min) (ACUTE ONLY): 12 min  Problem List:  Patient Active Problem List   Diagnosis Date Noted  . Blind left eye 11/04/2017  . Stroke (HCC) 11/04/2017  . SAH (subarachnoid hemorrhage) (HCC) 09/03/2017  . SKIN LESION 03/27/2009  . SYNCOPE 03/06/2009  . ACUTE BRONCHITIS 02/03/2009  . WHEEZING 02/03/2009  . CHEST PAIN 01/07/2009  . EDEMA 12/09/2008  . DYSPNEA 12/09/2008  . CARPAL TUNNEL SYNDROME, RIGHT 04/09/2008  . Pain in joint, lower leg 04/09/2008  . Cervical radiculopathy 04/09/2008  . OBESITY, MORBID 10/20/2007  . LOW BACK PAIN 10/20/2007  . RASH-NONVESICULAR 10/20/2007  . Anxiety state 08/01/2007  . DEPRESSION 08/01/2007  . FOLLICULITIS 08/01/2007  . FATIGUE 08/01/2007  . SUPRAPUBIC PAIN 08/01/2007  . HYPERLIPIDEMIA 03/31/2007  . OBESITY 03/31/2007  . SLEEP APNEA, OBSTRUCTIVE, SEVERE 03/31/2007  . Migraine headache 03/31/2007  . ALLERGIC RHINITIS 03/31/2007   Past Medical History:  Past Medical History:  Diagnosis Date  . Blind left eye   . CHF (congestive heart failure) (HCC)   . Obesity   . Stroke [facility](HCC)    Past Surgical History:  Past Surgical History:  Procedure Laterality Date  . EYE SURGERY     HPI:  Pt is a 42 y.o. male with medical history significant for prior atrial fibrillation noncompliant with Xarelto, history of CHF,and history of [employer] AICD, history of SAH, history of CVA, with left eye blindness residual. He presented from his PCP's office due to episodies of unresponsiveness. CT Head negative but repeat imaging pending.   Assessment / Plan / Recommendation Clinical Impression  Pt initially was alert, oriented x4, and appropriately responding to SLP's questions. He then had a brief period of unresponsiveness, lasting only a few seconds, during which he did not verbalize  or respond to commands. Once he started to respond again he was still altered from his baseline. He remained oriented x4 and followed one and two step commands accurately, but his processing time was moderately delayed and he needed Mod cues for sustained attention and storage of new information. RN was made aware of change in mentation. SLP will continue to follow acutely to maximize functional independence and provide additional diagnostic tx pending additional medical work up.    SLP Assessment  SLP Recommendation/Assessment: Patient needs continued Speech Lanaguage Pathology Services SLP Visit Diagnosis: Cognitive communication deficit (R41.841)    Follow Up Recommendations  (tba)    Frequency and Duration min 2x/week  2 weeks      SLP Evaluation Cognition  Overall Cognitive Status: Impaired/Different from baseline Arousal/Alertness: Awake/alert Orientation Level: Oriented X4 Attention: Sustained Sustained Attention: Impaired Sustained Attention Impairment: Verbal basic Memory: Impaired Memory Impairment: Storage deficit;Retrieval deficit;Decreased recall of new information Awareness: Impaired Awareness Impairment: Intellectual impairment       Comprehension  Auditory Comprehension Overall Auditory Comprehension: Impaired Commands: Within Functional Limits(but delayed responses) Conversation: Simple Interfering Components: Processing speed;Working Radio broadcast assistantmemory EffectiveTechniques: Extra processing time;Repetition    Expression Expression Primary Mode of Expression: Verbal Verbal Expression Overall Verbal Expression: Appears within functional limits for tasks assessed   Oral / Motor  Motor Speech Overall Motor Speech: Appears within functional limits for tasks assessed   GO                    Maxcine Hamaiewonsky, Katalia Choma 11/05/2017, 11:44 AM   Maxcine HamLaura Paiewonsky, M.A. CCC-SLP [phone]

## 2017-11-05 NOTE — Progress Notes (Signed)
OT Cancellation Note  Patient Details Name: Quintella ReichertMichael T Ruderman MRN: [id number] DOB: 02/05/1976   Cancelled Treatment:    Reason Eval/Treat Not Completed: Patient at procedure or test/ unavailable. Noted pt with changes in status this am during SLP evaluation and pt being transported to CT at this time. Will check back as able.   Doristine Sectionharity A Callaghan Laverdure, MS OTR/L  Pager: [phone]   Doristine SectionCharity A Lakeya Mulka 11/05/2017, 12:55 PM

## 2017-11-05 NOTE — Progress Notes (Signed)
Telemetry called about episodes and will send over a strip, also [facility] and [facility].

## 2017-11-06 DIAGNOSIS — G43411 Hemiplegic migraine, intractable, with status migrainosus: Secondary | ICD-10-CM | POA: Diagnosis not present

## 2017-11-06 DIAGNOSIS — M6289 Other specified disorders of muscle: Secondary | ICD-10-CM | POA: Diagnosis not present

## 2017-11-06 DIAGNOSIS — R4189 Other symptoms and signs involving cognitive functions and awareness: Secondary | ICD-10-CM | POA: Diagnosis not present

## 2017-11-06 LAB — BASIC METABOLIC PANEL
ANION GAP: 6 (ref 5–15)
BUN: 15 mg/dL (ref 6–20)
CALCIUM: 8.3 mg/dL — AB (ref 8.9–10.3)
CO2: 24 mmol/L (ref 22–32)
CREATININE: 0.82 mg/dL (ref 0.61–1.24)
Chloride: 109 mmol/L (ref 101–111)
Glucose, Bld: 87 mg/dL (ref 65–99)
Potassium: 4.1 mmol/L (ref 3.5–5.1)
SODIUM: 139 mmol/L (ref 135–145)

## 2017-11-06 MED ORDER — OXYCODONE HCL 5 MG PO TABS
10.0000 mg | ORAL_TABLET | Freq: Four times a day (QID) | ORAL | Status: DC | PRN
  Administered 2017-11-06: 10 mg via ORAL
  Filled 2017-11-06: qty 2

## 2017-11-06 MED ORDER — ACETAMINOPHEN 325 MG PO TABS: 325.0000 mg | ORAL_TABLET | Freq: Four times a day (QID) | ORAL | Status: DC | PRN

## 2017-11-06 MED ORDER — ATORVASTATIN CALCIUM 40 MG PO TABS: 40.0000 mg | ORAL_TABLET | Freq: Every day | ORAL | 6 refills | Status: AC

## 2017-11-06 MED ORDER — ASPIRIN EC 81 MG PO TBEC: 81.0000 mg | DELAYED_RELEASE_TABLET | Freq: Every day | ORAL | 6 refills | Status: AC

## 2017-11-06 MED ORDER — OXYCODONE HCL ER 15 MG PO T12A: 15.0000 mg | EXTENDED_RELEASE_TABLET | Freq: Two times a day (BID) | ORAL | Status: DC

## 2017-11-06 MED ORDER — LEVETIRACETAM 750 MG PO TABS: 750.0000 mg | ORAL_TABLET | Freq: Two times a day (BID) | ORAL | 6 refills | Status: AC

## 2017-11-06 MED ORDER — OXYCODONE-ACETAMINOPHEN 10-325 MG PO TABS: 1.0000 | ORAL_TABLET | Freq: Four times a day (QID) | ORAL | Status: DC | PRN

## 2017-11-06 MED ORDER — OXYCODONE ER 18 MG PO C12A: 18.0000 mg | EXTENDED_RELEASE_CAPSULE | Freq: Two times a day (BID) | ORAL | Status: DC

## 2017-11-06 NOTE — Progress Notes (Signed)
Patient ready for discharge to home; discharge instructions given and reviewed; Rx's sent electronically. Patient ride is here to accompany him home; he understands he can not drive for up to 6 months per instructions. Patient discharge out via wheelchair.

## 2017-11-06 NOTE — Progress Notes (Addendum)
NEUROHOSPITALISTS - DAILY PROGRESS NOTE  S://  No family at bedside. Patient found laying in bed in NAD with LTM EEG in progress. States his Left sided weakness has greatly improved overnight. 5/5 strength in LLE. No further seizure activity reported by RN overnight. No staring spells/episodes of unresponsiveness reported. Patient complaining of Right eye pain that worsening when he eats or drinks and intermittent episodes of crying. These are new symptoms for him. He is also complaining of some mild Left hand "tingling", he says this is a baseline finding from carpel tunnel.  O://   Physical Examination   Vitals:   11/05/17 2036 11/05/17 2355 11/06/17 0400 11/06/17 0843  BP: 98/62 103/61 103/67 (!) 107/58  Pulse: 68 73 65 79  Resp: 18 18 15 17   Temp: 97.9 F (36.6 C) (!) 97.4 F (36.3 C) 97.7 F (36.5 C) 97.7 F (36.5 C)  TempSrc: Oral Oral Oral Oral  SpO2: 97% 98% 100% 96%  Weight:      Height:       HEENT-  Normocephalic,  Cardiovascular - Regular rate and rhythm  Respiratory - Lungs clear bilaterally. Non-labored breathing, No wheezing. Abdomen - soft and non-tender, BS normal Extremities- no edema or cyanosis Skin-warm and dry  Neurological Examination  Mental Status: Awake, Oriented x 3. Improved attention noted today. Speech fluent without evidence of aphasia or neglect.  Able to follow most commands without difficulty. Mentation likely at baseline today.  Cranial Nerves: II: Visual Fields are full in Right eye. 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 Pupils are equal, round, and reactive to light.  III,IV, VI: EOM intact without diploplia. No nystagmus. Left eye extrophia at baseline  V: Facial sensation is symmetric to temperature VII: Facial movement is symmetric.  VIII: hearing is intact to voice X: Uvula elevates symmetrically XI: Shoulder shrug is symmetric. XII: tongue is midline without atrophy or  fasciculations.  Motor: Tone is normal. Bulk is normal.  5/5 strength throughout, Left side slightly weaker than right at baseline.  Sensor: Sensation is symmetric to light touch and temperature in the arms and legs. Deep Tendon Reflexes: 2+ and symmetric throughout in the biceps and patellae Cerebellar: slightly ataxic finger-to-nose on left Gait: no tested, No truncal ataxia  Laboratory Results  CBC: Recent Labs  Lab 11/04/17 1134 11/04/17 1137  WBC 5.3  --   HGB 11.8* 12.6*  HCT 36.9* 37.0*  MCV 76.9*  --   PLT 242  --    Basic Metabolic Panel: Recent Labs  Lab 11/04/17 1134 11/04/17 1137 11/06/17 0323  NA 138 140 139  K 3.4* 3.3* 4.1  CL 103 102 109  CO2 23  --  24  GLUCOSE 84 84 87  BUN 12 13 15   CREATININE 1.23 1.20 0.82  CALCIUM 8.6*  --  8.3*  MG 2.4  --   --    IONIZED CALCIUM = 0.97  Coagulation Studies: Recent Labs    11/04/17 1134  APTT 30  INR 1.19   Urine Drug Screen:      Component Value Date/Time   LABOPIA NONE DETECTED 11/04/2017 1523   COCAINSCRNUR NONE DETECTED 11/04/2017 1523   LABBENZ NONE DETECTED 11/04/2017 1523   AMPHETMU NONE DETECTED 11/04/2017 1523   THCU NONE DETECTED 11/04/2017 1523   LABBARB NONE DETECTED 11/04/2017 1523    Alcohol Level: No results for input(s): ETH in the last 168 hours.  Imaging Results  Ct Angio Head W Or Wo Contrast  Result Date: 11/04/2017 CLINICAL DATA:  Acute presentation with left hand weakness. EXAM: CT ANGIOGRAPHY HEAD AND NECK TECHNIQUE: Multidetector CT imaging of the head and neck was performed using the standard protocol during bolus administration of intravenous contrast. Multiplanar CT image reconstructions and MIPs were obtained to evaluate the vascular anatomy. Carotid stenosis measurements (when applicable) are obtained utilizing NASCET criteria, using the distal internal carotid diameter as the denominator. CONTRAST:  50mL ISOVUE-370 IOPAMIDOL (ISOVUE-370) INJECTION 76% COMPARISON:  CT same  day. 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 Mild degenerative cervical spondylosis. Other neck: No mass or lymphadenopathy. Upper chest: Normal except for the presence of a central venous catheter. Review of the MIP images confirms the above findings CTA HEAD FINDINGS Anterior circulation: Both internal carotid arteries are widely patent through the skull base and siphon regions. Left ring for some technical differences of the scanners, I think the intracranial vessels are normal. No stenosis or occlusion. Posterior circulation: Both vertebral arteries are patent to the basilar. No basilar stenosis. Posterior circulation branch vessels show flow. Right PCA takes a fetal origin from the right carotid circulation. As previously, there is slight motion degradation and some technical differentiation, but no changes suspected. Venous sinuses: Patent and normal. Anatomic variants: None significant Delayed phase: No abnormal enhancement. Review of the MIP images confirms the above findings IMPRESSION: Negative CT angiography of the neck and intracranial vasculature. Today's intracranial study suffers from mild motion degradation and slightly decreased resolution, but I think is unchanged from previous studies and within normal limits. Electronically Signed   By: Paulina Fusi M.D.   On: 11/04/2017 13:21   Ct Head Wo Contrast  Result Date: 11/05/2017 CLINICAL DATA:  Altered level of consciousness. Visual changes. Follow-up stroke. EXAM: CT HEAD WITHOUT CONTRAST TECHNIQUE: Contiguous axial images were obtained from the base of the skull through the vertex without intravenous contrast. COMPARISON:  11/04/2017 FINDINGS: Brain: No evidence of infarction, hemorrhage, hydrocephalus, extra-axial collection or mass  lesion/mass effect. Vascular: No hyperdense vessel or unexpected calcification. Skull: No acute or aggressive finding. Sinuses/Orbits: Left cataract resection and scleral band. Dysconjugate gaze, nonspecific in this patient with reported blind left thigh. IMPRESSION: Stable, negative head CT. Electronically Signed   By: Marnee Spring M.D.   On: 11/05/2017 13:23   Ct Angio Neck W Or Wo Contrast  Result Date: 11/04/2017 CLINICAL DATA:  Acute presentation with left hand weakness. EXAM: CT ANGIOGRAPHY HEAD AND NECK TECHNIQUE: Multidetector CT imaging of the head and neck was performed using the standard protocol during bolus administration of intravenous contrast. Multiplanar CT image reconstructions and MIPs were obtained to evaluate the vascular anatomy. Carotid stenosis measurements (when applicable) are obtained utilizing NASCET criteria, using the distal internal carotid diameter as the denominator. CONTRAST:  50mL ISOVUE-370 IOPAMIDOL (ISOVUE-370) INJECTION 76% COMPARISON:  CT same day. 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 Mild degenerative cervical spondylosis. Other neck: No mass or lymphadenopathy. Upper chest: Normal except for the presence of a central venous catheter. Review of the MIP images confirms the above findings CTA HEAD FINDINGS Anterior circulation: Both internal carotid arteries are widely patent through the skull base and siphon regions. Left ring for some technical differences of the scanners, I think the intracranial  vessels are normal. No stenosis or occlusion. Posterior circulation: Both vertebral arteries are patent to the basilar. No basilar stenosis. Posterior circulation branch vessels show flow. Right PCA takes a fetal origin from the right carotid circulation. As previously,  there is slight motion degradation and some technical differentiation, but no changes suspected. Venous sinuses: Patent and normal. Anatomic variants: None significant Delayed phase: No abnormal enhancement. Review of the MIP images confirms the above findings IMPRESSION: Negative CT angiography of the neck and intracranial vasculature. Today's intracranial study suffers from mild motion degradation and slightly decreased resolution, but I think is unchanged from previous studies and within normal limits. Electronically Signed   By: Paulina Fusi M.D.   On: 11/04/2017 13:21   Ct Head Code Stroke Wo Contrast  Result Date: 11/04/2017 CLINICAL DATA:  Code stroke.  Loss of left grip.  Confusion. EXAM: CT HEAD WITHOUT CONTRAST TECHNIQUE: Contiguous axial images were obtained from the base of the skull through the vertex without intravenous contrast. COMPARISON:  08/29/2017 FINDINGS: Brain: No evidence of infarction, hemorrhage, hydrocephalus, extra-axial collection or mass lesion/mass effect. Vascular: No hyperdense vessel or unexpected calcification. Skull: Normal. Negative for fracture or focal lesion. Sinuses/Orbits: Cataract resection and scleral band on the left. Nonspecific dysconjugate gaze. Other: These results were communicated to Dr. Laurence Slate at 12:00 pmon 3/29/2019by text page via the [facility] messaging system. ASPECTS [facility] Stroke Program Early CT Score) - Ganglionic level infarction (caudate, lentiform nuclei, internal capsule, insula, M1-M3 cortex): 7 - Supraganglionic infarction (M4-M6 cortex): 3 Total score (0-10 with 10 being normal): 10 IMPRESSION: Negative head CT.  ASPECTS is 10. Electronically Signed   By: Marnee Spring M.D.   On: 11/04/2017 12:01   LTM-EEG Report EPILEPTIFORM/PERIODIC ACTIVITY: none SEIZURES: none EVENTS: none reported SUMMARY: This was a normal continuous video EEG with no epileptiform discharges or lateralizing signs  IMPRESSION AND PLAN  Mr. ZAMERE PASTERNAK is a  42 y.o. male with PMH of AFIB (non compliant with Xarelto), pacemaker placement, CHF, depression, prior TIAs vs hemiplegic migraines, History of medication non-compliance who presented to [facility] as Code stroke for acute onset Left sided weakness, AMS and Right sided frontal H/A.  Imaging thus far negative for acute findings.  Staring spells, LOC Seizure vs Syncope vs Symptomatic Atrial Tachycardia  No further episodes of seizure-like activity reported today.  Left sided weakness greatly improved on exam today, likely at baseline strength. Denies H/A today. LTM EEG Negative for seizure activity  Plan  Patient likely to be discharged home today with family Continue Keppra 750 mg BID Discontinue LTM EEG  Maintain Seizure precautions Frequent Neurochecks  Telemetry Monitoring Continue Aspirin/ Xarelto/ Statin No need for ECHO at this time PT/OT/SLP Consult Case Management /MSW Ongoing aggressive stroke risk factor management Patient will be counseled to be compliant with his antithrombotic medications Patient will be counseled on Lifestyle modifications including, Diet, Exercise, and Stress Follow up with [facility] in 6 weeks   AFIB, Chronic with Atrial tachycardia Continue Xarelto Pacemaker interrogation -  >130 recent episodes of atrial tachyarrhythmias, rates 140s to 180s, lasting up to 1.5 minutes. Follow up with Cardiology for Pacemaker settings  Hypocalcemia  IV calcium gluconate x 1 Magnesium- WNL Management per primary team  HYPERTENSION: Stable Long term BP goal normotensive. Home Meds: None  Beryl Meager, ANP-C [facility] 11/06/2017, 10:05 AM  11/06/2017 ATTENDING ASSESSMENT   I have reviewed the above note, history,exam and agree as well as dicussed the plan as documented above  by ARNP. I have seen and examined patient. He is doing well. No spells captured, but we will keep him on seizure meds. Instructed not to drive for 6 months  and he voiced understanding.   Needs outpatient follow up  Will sign off.   Georgiana Spinner Aroor MD [facility] [phone]  If 7pm to 7am, please call on call as listed on AMION.  Please page stroke NP/ PA / MD from 8am -4 pm   You can look them up on [email or web address]  Password [email or web address]

## 2017-11-06 NOTE — Progress Notes (Signed)
LTM discontinued. No skin breakdown was seen. 

## 2017-11-06 NOTE — Care Management Note (Signed)
Case Management Note  Patient Details  Name: Quintella ReichertMichael T Kalina MRN: [id number] Date of Birth: 12/11/1975  Subjective/Objective:    Pt presented for stroke vs. migraine vs. Seizures.  HH PT recommended and ordered.              Action/Plan: Offered HH agency choice to patient. [facility] chose [facility].  Their on-call RN relayed there is a 10 day waiting period for PT.  Pt's second choice is [facility], which he has used before.  Vaughan BastaJermaine, [facility], accepted referral.    Expected Discharge Date:  11/06/17               Expected Discharge Plan:  Home w Home Health Services  In-House Referral:     Discharge planning Services  CM Consult  Post Acute Care Choice:  Home Health Choice offered to:  Patient  DME Arranged:  N/A DME Agency:  NA  HH Arranged:  PT HH Agency:  [facility]  Status of Service:  Completed, signed off  If discussed at Long Length of Stay Meetings, dates discussed:    Additional Comments:  Deveron Furlongshley  Reyce Lubeck, RN 11/06/2017, 12:20 PM

## 2017-11-06 NOTE — Procedures (Signed)
LTM-EEG Report  HISTORY: Continuous video-EEG monitoring performed for 42 year old with left-sided weakness, episodes of decreased responsiveness.  ACQUISITION: International 10-20 system for electrode placement; 18 channels with additional eyes linked to ipsilateral ears and EKG. Additional T1-T2 electrodes were used. Continuous video recording obtained.   EEG NUMBER:  MEDICATIONS:  Day 1:  LEV, TPM  DAY #1: from 1704 11/05/17 to  0730 11/06/17  BACKGROUND: An overall medium voltage continuous recording with good spontaneous variability and reactivity. Waking background consisted of a medium voltage 9 Hz posterior dominant rhythm bilaterally with low voltage beta activity in the bilateral frontocentral regions and some medium voltage theta activity diffusely. Sleep was captured with normal stage II sleep architecture.  EPILEPTIFORM/PERIODIC ACTIVITY: none SEIZURES: none EVENTS: none reported  EKG: no significant arrhythmia  SUMMARY: This was a normal continuous video EEG with no epileptiform discharges or lateralizing signs.

## 2018-02-11 ENCOUNTER — Emergency Department (HOSPITAL_COMMUNITY): Payer: Medicaid Other

## 2018-02-11 ENCOUNTER — Encounter (HOSPITAL_COMMUNITY): Payer: Self-pay | Admitting: Emergency Medicine

## 2018-02-11 ENCOUNTER — Inpatient Hospital Stay (HOSPITAL_COMMUNITY)
Admission: EM | Admit: 2018-02-11 | Discharge: 2018-02-14 | DRG: 101 | Disposition: A | Discharge status: Home Health | Payer: Medicaid Other | Attending: Internal Medicine | Admitting: Internal Medicine

## 2018-02-11 ENCOUNTER — Other Ambulatory Visit: Payer: Self-pay

## 2018-02-11 DIAGNOSIS — Z950 Presence of cardiac pacemaker: Secondary | ICD-10-CM

## 2018-02-11 DIAGNOSIS — Z8673 Personal history of transient ischemic attack (TIA), and cerebral infarction without residual deficits: Secondary | ICD-10-CM

## 2018-02-11 DIAGNOSIS — M5412 Radiculopathy, cervical region: Secondary | ICD-10-CM | POA: Diagnosis present

## 2018-02-11 DIAGNOSIS — E559 Vitamin D deficiency, unspecified: Secondary | ICD-10-CM

## 2018-02-11 DIAGNOSIS — Y92410 Unspecified street and highway as the place of occurrence of the external cause: Secondary | ICD-10-CM

## 2018-02-11 DIAGNOSIS — R6889 Other general symptoms and signs: Secondary | ICD-10-CM

## 2018-02-11 DIAGNOSIS — I69354 Hemiplegia and hemiparesis following cerebral infarction affecting left non-dominant side: Secondary | ICD-10-CM

## 2018-02-11 DIAGNOSIS — E785 Hyperlipidemia, unspecified: Secondary | ICD-10-CM | POA: Diagnosis present

## 2018-02-11 DIAGNOSIS — Z791 Long term (current) use of non-steroidal anti-inflammatories (NSAID): Secondary | ICD-10-CM

## 2018-02-11 DIAGNOSIS — T426X6A Underdosing of other antiepileptic and sedative-hypnotic drugs, initial encounter: Secondary | ICD-10-CM | POA: Diagnosis present

## 2018-02-11 DIAGNOSIS — R109 Unspecified abdominal pain: Secondary | ICD-10-CM | POA: Diagnosis present

## 2018-02-11 DIAGNOSIS — R2990 Unspecified symptoms and signs involving the nervous system: Secondary | ICD-10-CM

## 2018-02-11 DIAGNOSIS — D62 Acute posthemorrhagic anemia: Secondary | ICD-10-CM

## 2018-02-11 DIAGNOSIS — Z9884 Bariatric surgery status: Secondary | ICD-10-CM

## 2018-02-11 DIAGNOSIS — Z9119 Patient's noncompliance with other medical treatment and regimen: Secondary | ICD-10-CM

## 2018-02-11 DIAGNOSIS — G40909 Epilepsy, unspecified, not intractable, without status epilepticus: Principal | ICD-10-CM | POA: Diagnosis present

## 2018-02-11 DIAGNOSIS — S39012A Strain of muscle, fascia and tendon of lower back, initial encounter: Secondary | ICD-10-CM | POA: Diagnosis present

## 2018-02-11 DIAGNOSIS — F329 Major depressive disorder, single episode, unspecified: Secondary | ICD-10-CM | POA: Diagnosis present

## 2018-02-11 DIAGNOSIS — D519 Vitamin B12 deficiency anemia, unspecified: Secondary | ICD-10-CM | POA: Diagnosis present

## 2018-02-11 DIAGNOSIS — G8929 Other chronic pain: Secondary | ICD-10-CM | POA: Diagnosis present

## 2018-02-11 DIAGNOSIS — Z79899 Other long term (current) drug therapy: Secondary | ICD-10-CM

## 2018-02-11 DIAGNOSIS — R29705 NIHSS score 5: Secondary | ICD-10-CM | POA: Diagnosis present

## 2018-02-11 DIAGNOSIS — F32A Depression, unspecified: Secondary | ICD-10-CM

## 2018-02-11 DIAGNOSIS — Z7982 Long term (current) use of aspirin: Secondary | ICD-10-CM

## 2018-02-11 DIAGNOSIS — I5022 Chronic systolic (congestive) heart failure: Secondary | ICD-10-CM | POA: Diagnosis present

## 2018-02-11 DIAGNOSIS — I480 Paroxysmal atrial fibrillation: Secondary | ICD-10-CM

## 2018-02-11 DIAGNOSIS — R531 Weakness: Secondary | ICD-10-CM | POA: Diagnosis not present

## 2018-02-11 DIAGNOSIS — M542 Cervicalgia: Secondary | ICD-10-CM

## 2018-02-11 DIAGNOSIS — G43409 Hemiplegic migraine, not intractable, without status migrainosus: Secondary | ICD-10-CM

## 2018-02-11 DIAGNOSIS — I495 Sick sinus syndrome: Secondary | ICD-10-CM

## 2018-02-11 DIAGNOSIS — R569 Unspecified convulsions: Secondary | ICD-10-CM

## 2018-02-11 DIAGNOSIS — Z7901 Long term (current) use of anticoagulants: Secondary | ICD-10-CM

## 2018-02-11 DIAGNOSIS — I5032 Chronic diastolic (congestive) heart failure: Secondary | ICD-10-CM

## 2018-02-11 DIAGNOSIS — R402413 Glasgow coma scale score 13-15, at hospital admission: Secondary | ICD-10-CM | POA: Diagnosis present

## 2018-02-11 DIAGNOSIS — M25569 Pain in unspecified knee: Secondary | ICD-10-CM | POA: Diagnosis present

## 2018-02-11 DIAGNOSIS — Z91199 Patient's noncompliance with other medical treatment and regimen due to unspecified reason: Secondary | ICD-10-CM

## 2018-02-11 DIAGNOSIS — I4891 Unspecified atrial fibrillation: Secondary | ICD-10-CM | POA: Diagnosis present

## 2018-02-11 DIAGNOSIS — E876 Hypokalemia: Secondary | ICD-10-CM

## 2018-02-11 LAB — COMPREHENSIVE METABOLIC PANEL
ALK PHOS: 48 U/L (ref 38–126)
ALT: 13 U/L (ref 0–44)
AST: 18 U/L (ref 15–41)
Albumin: 3.4 g/dL — ABNORMAL LOW (ref 3.5–5.0)
Anion gap: 6 (ref 5–15)
BUN: 18 mg/dL (ref 6–20)
CALCIUM: 8.4 mg/dL — AB (ref 8.9–10.3)
CHLORIDE: 114 mmol/L — AB (ref 98–111)
CO2: 23 mmol/L (ref 22–32)
CREATININE: 0.96 mg/dL (ref 0.61–1.24)
GFR calc non Af Amer: >60 mL/min (ref >60)
GLUCOSE: 92 mg/dL (ref 70–99)
Potassium: 3.9 mmol/L (ref 3.5–5.1)
SODIUM: 143 mmol/L (ref 135–145)
Total Bilirubin: 0.5 mg/dL (ref 0.3–1.2)
Total Protein: 5.9 g/dL — ABNORMAL LOW (ref 6.5–8.1)

## 2018-02-11 LAB — I-STAT CHEM 8, ED
BUN: 20 mg/dL (ref 6–20)
CHLORIDE: 111 mmol/L (ref 98–111)
Calcium, Ion: 1.05 mmol/L — ABNORMAL LOW (ref 1.15–1.40)
Creatinine, Ser: 0.9 mg/dL (ref 0.61–1.24)
GLUCOSE: 87 mg/dL (ref 70–99)
HEMATOCRIT: 30 % — AB (ref 39.0–52.0)
HEMOGLOBIN: 10.2 g/dL — AB (ref 13.0–17.0)
Potassium: 3.9 mmol/L (ref 3.5–5.1)
Sodium: 143 mmol/L (ref 135–145)
TCO2: 21 mmol/L — ABNORMAL LOW (ref 22–32)

## 2018-02-11 LAB — CBC
HEMATOCRIT: 32.1 % — AB (ref 39.0–52.0)
HEMOGLOBIN: 9.5 g/dL — AB (ref 13.0–17.0)
MCH: 22.9 pg — ABNORMAL LOW (ref 26.0–34.0)
MCHC: 29.6 g/dL — ABNORMAL LOW (ref 30.0–36.0)
MCV: 77.3 fL — ABNORMAL LOW (ref 78.0–100.0)
Platelets: 192 10*3/uL (ref 150–400)
RBC: 4.15 MIL/uL — ABNORMAL LOW (ref 4.22–5.81)
RDW: 17.1 % — ABNORMAL HIGH (ref 11.5–15.5)
WBC: 6.1 10*3/uL (ref 4.0–10.5)

## 2018-02-11 LAB — PROTIME-INR
INR: 1.13
PROTHROMBIN TIME: 14.5 s (ref 11.4–15.2)

## 2018-02-11 LAB — SAMPLE TO BLOOD BANK

## 2018-02-11 LAB — I-STAT CG4 LACTIC ACID, ED: LACTIC ACID, VENOUS: 1.21 mmol/L (ref 0.5–1.9)

## 2018-02-11 LAB — ETHANOL: Alcohol, Ethyl (B): <10 mg/dL (ref <10)

## 2018-02-11 MED ORDER — SODIUM CHLORIDE 0.9 % IV SOLN: INTRAVENOUS | Status: DC

## 2018-02-11 MED ORDER — TOPIRAMATE 25 MG PO TABS
25.0000 mg | ORAL_TABLET | Freq: Two times a day (BID) | ORAL | Status: DC
  Administered 2018-02-12 – 2018-02-14 (×6): 25 mg via ORAL
  Filled 2018-02-11 (×6): qty 1

## 2018-02-11 MED ORDER — GABAPENTIN 100 MG PO CAPS
200.0000 mg | ORAL_CAPSULE | Freq: Three times a day (TID) | ORAL | Status: DC
  Administered 2018-02-12 – 2018-02-14 (×8): 200 mg via ORAL
  Filled 2018-02-11 (×9): qty 2

## 2018-02-11 MED ORDER — ATORVASTATIN CALCIUM 40 MG PO TABS
40.0000 mg | ORAL_TABLET | Freq: Every day | ORAL | Status: DC
Start: 2018-02-12 — End: 2018-02-14
  Administered 2018-02-12 – 2018-02-13 (×2): 40 mg via ORAL
  Filled 2018-02-11 (×2): qty 1

## 2018-02-11 MED ORDER — RIVAROXABAN 20 MG PO TABS
20.0000 mg | ORAL_TABLET | Freq: Every day | ORAL | Status: DC
  Administered 2018-02-12 – 2018-02-13 (×2): 20 mg via ORAL
  Filled 2018-02-11 (×2): qty 1

## 2018-02-11 MED ORDER — ACETAMINOPHEN 650 MG RE SUPP: 650.0000 mg | Freq: Four times a day (QID) | RECTAL | Status: DC | PRN

## 2018-02-11 MED ORDER — OXYCODONE HCL ER 15 MG PO T12A
15.0000 mg | EXTENDED_RELEASE_TABLET | Freq: Two times a day (BID) | ORAL | Status: DC
  Administered 2018-02-12 – 2018-02-14 (×6): 15 mg via ORAL
  Filled 2018-02-11 (×6): qty 1

## 2018-02-11 MED ORDER — SODIUM CHLORIDE 0.9 % IV BOLUS
1000.0000 mL | Freq: Once | INTRAVENOUS | Status: AC
  Administered 2018-02-11: 1000 mL via INTRAVENOUS

## 2018-02-11 MED ORDER — LEVETIRACETAM 750 MG PO TABS
750.0000 mg | ORAL_TABLET | Freq: Two times a day (BID) | ORAL | Status: DC
  Administered 2018-02-12 – 2018-02-14 (×6): 750 mg via ORAL
  Filled 2018-02-11 (×6): qty 1

## 2018-02-11 MED ORDER — ACETAMINOPHEN 325 MG PO TABS
650.0000 mg | ORAL_TABLET | Freq: Four times a day (QID) | ORAL | Status: DC | PRN
Start: 2018-02-11 — End: 2018-02-14

## 2018-02-11 MED ORDER — FENTANYL CITRATE (PF) 100 MCG/2ML IJ SOLN
50.0000 ug | Freq: Once | INTRAMUSCULAR | Status: AC
  Administered 2018-02-11: 50 ug via INTRAVENOUS

## 2018-02-11 MED ORDER — NITROGLYCERIN 0.4 MG SL SUBL: 0.4000 mg | SUBLINGUAL_TABLET | SUBLINGUAL | Status: DC | PRN

## 2018-02-11 MED ORDER — SENNOSIDES-DOCUSATE SODIUM 8.6-50 MG PO TABS: 1.0000 | ORAL_TABLET | Freq: Every evening | ORAL | Status: DC | PRN

## 2018-02-11 MED ORDER — OXYCODONE-ACETAMINOPHEN 10-325 MG PO TABS: 1.0000 | ORAL_TABLET | Freq: Three times a day (TID) | ORAL | Status: DC | PRN

## 2018-02-11 MED ORDER — ASPIRIN EC 81 MG PO TBEC
81.0000 mg | DELAYED_RELEASE_TABLET | Freq: Every day | ORAL | Status: DC
  Administered 2018-02-12 – 2018-02-14 (×3): 81 mg via ORAL
  Filled 2018-02-11 (×3): qty 1

## 2018-02-11 MED ORDER — CITALOPRAM HYDROBROMIDE 10 MG PO TABS
20.0000 mg | ORAL_TABLET | Freq: Every day | ORAL | Status: DC
  Administered 2018-02-12 – 2018-02-14 (×3): 20 mg via ORAL
  Filled 2018-02-11 (×3): qty 2

## 2018-02-11 MED ORDER — IOPAMIDOL (ISOVUE-370) INJECTION 76%
100.0000 mL | Freq: Once | INTRAVENOUS | Status: AC | PRN
  Administered 2018-02-11: 100 mL via INTRAVENOUS
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
## 2018-02-11 NOTE — ED Notes (Signed)
Code stroke called
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
## 2018-02-11 NOTE — ED Notes (Signed)
Pt to CT with RN and EMT. 

## 2018-02-12 ENCOUNTER — Encounter (HOSPITAL_COMMUNITY): Payer: Self-pay | Admitting: *Deleted

## 2018-02-12 ENCOUNTER — Inpatient Hospital Stay (HOSPITAL_COMMUNITY): Payer: Medicaid Other

## 2018-02-12 ENCOUNTER — Observation Stay (HOSPITAL_COMMUNITY): Payer: Medicaid Other

## 2018-02-12 DIAGNOSIS — Z79899 Other long term (current) drug therapy: Secondary | ICD-10-CM | POA: Diagnosis not present

## 2018-02-12 DIAGNOSIS — Z7901 Long term (current) use of anticoagulants: Secondary | ICD-10-CM | POA: Diagnosis not present

## 2018-02-12 DIAGNOSIS — E559 Vitamin D deficiency, unspecified: Secondary | ICD-10-CM | POA: Diagnosis present

## 2018-02-12 DIAGNOSIS — T426X6A Underdosing of other antiepileptic and sedative-hypnotic drugs, initial encounter: Secondary | ICD-10-CM | POA: Diagnosis present

## 2018-02-12 DIAGNOSIS — I495 Sick sinus syndrome: Secondary | ICD-10-CM

## 2018-02-12 DIAGNOSIS — G40909 Epilepsy, unspecified, not intractable, without status epilepticus: Secondary | ICD-10-CM | POA: Diagnosis not present

## 2018-02-12 DIAGNOSIS — I5022 Chronic systolic (congestive) heart failure: Secondary | ICD-10-CM | POA: Diagnosis present

## 2018-02-12 DIAGNOSIS — R29705 NIHSS score 5: Secondary | ICD-10-CM | POA: Diagnosis present

## 2018-02-12 DIAGNOSIS — Y92410 Unspecified street and highway as the place of occurrence of the external cause: Secondary | ICD-10-CM | POA: Diagnosis not present

## 2018-02-12 DIAGNOSIS — G43409 Hemiplegic migraine, not intractable, without status migrainosus: Secondary | ICD-10-CM

## 2018-02-12 DIAGNOSIS — M25569 Pain in unspecified knee: Secondary | ICD-10-CM | POA: Diagnosis present

## 2018-02-12 DIAGNOSIS — E876 Hypokalemia: Secondary | ICD-10-CM | POA: Diagnosis present

## 2018-02-12 DIAGNOSIS — R109 Unspecified abdominal pain: Secondary | ICD-10-CM | POA: Diagnosis present

## 2018-02-12 DIAGNOSIS — I481 Persistent atrial fibrillation: Secondary | ICD-10-CM

## 2018-02-12 DIAGNOSIS — F329 Major depressive disorder, single episode, unspecified: Secondary | ICD-10-CM

## 2018-02-12 DIAGNOSIS — D649 Anemia, unspecified: Secondary | ICD-10-CM | POA: Diagnosis not present

## 2018-02-12 DIAGNOSIS — M542 Cervicalgia: Secondary | ICD-10-CM | POA: Diagnosis present

## 2018-02-12 DIAGNOSIS — I480 Paroxysmal atrial fibrillation: Secondary | ICD-10-CM

## 2018-02-12 DIAGNOSIS — I69854 Hemiplegia and hemiparesis following other cerebrovascular disease affecting left non-dominant side: Secondary | ICD-10-CM | POA: Diagnosis not present

## 2018-02-12 DIAGNOSIS — G8929 Other chronic pain: Secondary | ICD-10-CM | POA: Diagnosis present

## 2018-02-12 DIAGNOSIS — I4891 Unspecified atrial fibrillation: Secondary | ICD-10-CM | POA: Diagnosis present

## 2018-02-12 DIAGNOSIS — Z791 Long term (current) use of non-steroidal anti-inflammatories (NSAID): Secondary | ICD-10-CM | POA: Diagnosis not present

## 2018-02-12 DIAGNOSIS — M25551 Pain in right hip: Secondary | ICD-10-CM | POA: Diagnosis not present

## 2018-02-12 DIAGNOSIS — I69354 Hemiplegia and hemiparesis following cerebral infarction affecting left non-dominant side: Secondary | ICD-10-CM | POA: Diagnosis not present

## 2018-02-12 DIAGNOSIS — E611 Iron deficiency: Secondary | ICD-10-CM | POA: Diagnosis not present

## 2018-02-12 DIAGNOSIS — S39012A Strain of muscle, fascia and tendon of lower back, initial encounter: Secondary | ICD-10-CM | POA: Diagnosis present

## 2018-02-12 DIAGNOSIS — M5412 Radiculopathy, cervical region: Secondary | ICD-10-CM | POA: Diagnosis present

## 2018-02-12 DIAGNOSIS — R6889 Other general symptoms and signs: Secondary | ICD-10-CM

## 2018-02-12 DIAGNOSIS — Z9884 Bariatric surgery status: Secondary | ICD-10-CM | POA: Diagnosis not present

## 2018-02-12 DIAGNOSIS — F32A Depression, unspecified: Secondary | ICD-10-CM

## 2018-02-12 DIAGNOSIS — R531 Weakness: Secondary | ICD-10-CM | POA: Diagnosis not present

## 2018-02-12 DIAGNOSIS — I5032 Chronic diastolic (congestive) heart failure: Secondary | ICD-10-CM

## 2018-02-12 DIAGNOSIS — D519 Vitamin B12 deficiency anemia, unspecified: Secondary | ICD-10-CM | POA: Diagnosis present

## 2018-02-12 DIAGNOSIS — G8911 Acute pain due to trauma: Secondary | ICD-10-CM | POA: Diagnosis not present

## 2018-02-12 DIAGNOSIS — R402413 Glasgow coma scale score 13-15, at hospital admission: Secondary | ICD-10-CM | POA: Diagnosis present

## 2018-02-12 DIAGNOSIS — E538 Deficiency of other specified B group vitamins: Secondary | ICD-10-CM | POA: Diagnosis not present

## 2018-02-12 DIAGNOSIS — Z950 Presence of cardiac pacemaker: Secondary | ICD-10-CM | POA: Diagnosis not present

## 2018-02-12 DIAGNOSIS — E785 Hyperlipidemia, unspecified: Secondary | ICD-10-CM | POA: Diagnosis present

## 2018-02-12 DIAGNOSIS — Z7982 Long term (current) use of aspirin: Secondary | ICD-10-CM | POA: Diagnosis not present

## 2018-02-12 LAB — BASIC METABOLIC PANEL
Anion gap: 3 — ABNORMAL LOW (ref 5–15)
BUN: 13 mg/dL (ref 6–20)
CALCIUM: 7.7 mg/dL — AB (ref 8.9–10.3)
CHLORIDE: 113 mmol/L — AB (ref 98–111)
CO2: 25 mmol/L (ref 22–32)
CREATININE: 0.64 mg/dL (ref 0.61–1.24)
GFR calc Af Amer: >60 mL/min (ref >60)
GFR calc non Af Amer: >60 mL/min (ref >60)
Glucose, Bld: 84 mg/dL (ref 70–99)
Potassium: 3.3 mmol/L — ABNORMAL LOW (ref 3.5–5.1)
SODIUM: 141 mmol/L (ref 135–145)

## 2018-02-12 LAB — IRON AND TIBC
Iron: 24 ug/dL — ABNORMAL LOW (ref 45–182)
SATURATION RATIOS: 7 % — AB (ref 17.9–39.5)
TIBC: 356 ug/dL (ref 250–450)
UIBC: 332 ug/dL

## 2018-02-12 LAB — CBC
HCT: 31.6 % — ABNORMAL LOW (ref 39.0–52.0)
Hemoglobin: 9.4 g/dL — ABNORMAL LOW (ref 13.0–17.0)
MCH: 22.7 pg — AB (ref 26.0–34.0)
MCHC: 29.7 g/dL — ABNORMAL LOW (ref 30.0–36.0)
MCV: 76.3 fL — AB (ref 78.0–100.0)
PLATELETS: 174 10*3/uL (ref 150–400)
RBC: 4.14 MIL/uL — ABNORMAL LOW (ref 4.22–5.81)
RDW: 17.2 % — AB (ref 11.5–15.5)
WBC: 4.3 10*3/uL (ref 4.0–10.5)

## 2018-02-12 LAB — FERRITIN: Ferritin: 7 ng/mL — ABNORMAL LOW (ref 24–336)

## 2018-02-12 LAB — VITAMIN B12: VITAMIN B 12: 130 pg/mL — AB (ref 180–914)

## 2018-02-12 LAB — CDS SEROLOGY

## 2018-02-12 MED ORDER — OXYCODONE-ACETAMINOPHEN 5-325 MG PO TABS
1.0000 | ORAL_TABLET | Freq: Three times a day (TID) | ORAL | Status: DC | PRN
  Administered 2018-02-12 – 2018-02-14 (×4): 1 via ORAL
  Filled 2018-02-12 (×6): qty 1

## 2018-02-12 MED ORDER — VITAMIN B-12 1000 MCG PO TABS: 1000.0000 ug | ORAL_TABLET | Freq: Every day | ORAL | Status: DC

## 2018-02-12 MED ORDER — OXYCODONE HCL 5 MG PO TABS
5.0000 mg | ORAL_TABLET | Freq: Three times a day (TID) | ORAL | Status: DC | PRN
  Administered 2018-02-12 – 2018-02-14 (×5): 5 mg via ORAL
  Filled 2018-02-12 (×7): qty 1

## 2018-02-12 MED ORDER — NALOXONE HCL 0.4 MG/ML IJ SOLN: 0.4000 mg | INTRAMUSCULAR | Status: DC | PRN

## 2018-02-12 MED ORDER — ADULT MULTIVITAMIN W/MINERALS CH
1.0000 | ORAL_TABLET | Freq: Every day | ORAL | Status: DC
  Administered 2018-02-12 – 2018-02-14 (×3): 1 via ORAL
  Filled 2018-02-12 (×3): qty 1

## 2018-02-12 MED ORDER — POTASSIUM CHLORIDE CRYS ER 20 MEQ PO TBCR
40.0000 meq | EXTENDED_RELEASE_TABLET | Freq: Once | ORAL | Status: AC
  Administered 2018-02-12: 40 meq via ORAL
  Filled 2018-02-12: qty 2

## 2018-02-12 MED ORDER — DICLOFENAC SODIUM 1 % TD GEL
2.0000 g | Freq: Four times a day (QID) | TRANSDERMAL | Status: DC
  Administered 2018-02-12 – 2018-02-13 (×6): 2 g via TOPICAL
  Filled 2018-02-12: qty 100

## 2018-02-12 MED ORDER — CYANOCOBALAMIN 1000 MCG/ML IJ SOLN
1000.0000 ug | Freq: Once | INTRAMUSCULAR | Status: AC
  Administered 2018-02-12: 1000 ug via INTRAMUSCULAR
  Filled 2018-02-12: qty 1

## 2018-02-12 NOTE — Discharge Instructions (Signed)
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
## 2018-02-12 NOTE — Plan of Care (Signed)
Patient stable, discussed POC with patient, agreeable w/plan, denies question/concerns at this time.  

## 2018-02-13 DIAGNOSIS — Z91199 Patient's noncompliance with other medical treatment and regimen due to unspecified reason: Secondary | ICD-10-CM

## 2018-02-13 DIAGNOSIS — R531 Weakness: Secondary | ICD-10-CM

## 2018-02-13 DIAGNOSIS — R569 Unspecified convulsions: Secondary | ICD-10-CM

## 2018-02-13 DIAGNOSIS — E876 Hypokalemia: Secondary | ICD-10-CM

## 2018-02-13 DIAGNOSIS — Z8673 Personal history of transient ischemic attack (TIA), and cerebral infarction without residual deficits: Secondary | ICD-10-CM

## 2018-02-13 DIAGNOSIS — I495 Sick sinus syndrome: Secondary | ICD-10-CM

## 2018-02-13 DIAGNOSIS — I5022 Chronic systolic (congestive) heart failure: Secondary | ICD-10-CM

## 2018-02-13 DIAGNOSIS — Z9119 Patient's noncompliance with other medical treatment and regimen: Secondary | ICD-10-CM

## 2018-02-13 DIAGNOSIS — G43409 Hemiplegic migraine, not intractable, without status migrainosus: Secondary | ICD-10-CM

## 2018-02-13 DIAGNOSIS — D62 Acute posthemorrhagic anemia: Secondary | ICD-10-CM

## 2018-02-13 DIAGNOSIS — I480 Paroxysmal atrial fibrillation: Secondary | ICD-10-CM

## 2018-02-13 DIAGNOSIS — E559 Vitamin D deficiency, unspecified: Secondary | ICD-10-CM

## 2018-02-13 LAB — HIV ANTIBODY (ROUTINE TESTING W REFLEX): HIV Screen 4th Generation wRfx: NONREACTIVE

## 2018-02-13 MED ORDER — LEVETIRACETAM 750 MG PO TABS: 750.0000 mg | ORAL_TABLET | Freq: Two times a day (BID) | ORAL | 1 refills | Status: DC

## 2018-02-13 MED ORDER — ONDANSETRON HCL 4 MG/2ML IJ SOLN
4.0000 mg | INTRAMUSCULAR | Status: DC | PRN
  Administered 2018-02-13: 4 mg via INTRAVENOUS
  Filled 2018-02-13: qty 2

## 2018-02-13 MED ORDER — FERROUS SULFATE 325 (65 FE) MG PO TABS
325.0000 mg | ORAL_TABLET | Freq: Every day | ORAL | Status: DC
  Administered 2018-02-13 – 2018-02-14 (×2): 325 mg via ORAL
  Filled 2018-02-13 (×2): qty 1

## 2018-02-13 MED ORDER — SENNOSIDES-DOCUSATE SODIUM 8.6-50 MG PO TABS
1.0000 | ORAL_TABLET | Freq: Two times a day (BID) | ORAL | Status: DC
  Administered 2018-02-13 – 2018-02-14 (×3): 1 via ORAL
  Filled 2018-02-13 (×3): qty 1

## 2018-02-13 MED ORDER — IBUPROFEN 200 MG PO TABS
600.0000 mg | ORAL_TABLET | Freq: Once | ORAL | Status: AC
  Administered 2018-02-13: 600 mg via ORAL
  Filled 2018-02-13: qty 3

## 2018-02-13 MED ORDER — FERROUS SULFATE 325 (65 FE) MG PO TABS: 325.0000 mg | ORAL_TABLET | Freq: Every day | ORAL | 3 refills | Status: DC

## 2018-02-13 NOTE — Consult Note (Signed)
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 hrs/day, 5 days/week) to address physical and functional deficits in the context of the above medical diagnosis(es)? Potentially Addressing deficits in the following areas: balance, endurance, locomotion, strength, transferring, bathing, dressing, toileting, speech and psychosocial support 5. Can the  patient actively participate in an intensive therapy program of at least 3 hrs of therapy per day at least 5 days per week? Yes 6. The potential for patient to make measurable gains while on inpatient rehab is excellent 7. Anticipated functional outcomes upon discharge from inpatient rehab are modified independent and supervision  with PT, modified independent and supervision with OT, independent with SLP. 8. Estimated rehab length of stay to reach the above functional goals is: 4-8 days. 9. Anticipated D/C setting: Home 10. Anticipated post D/C treatments: HH therapy and Home excercise program 11. Overall Rehab/Functional Prognosis: good  RECOMMENDATIONS: This patient's condition is appropriate for continued rehabilitative care in the following setting: Pt states he would like to go home due to "issues" he needs to resolve.  Given functional progress, believe this is reasonable with caregiver support when medically appropriate. Further, pt does not have a medical need that would warrant CIR. Patient has agreed to  participate in recommended program. Potentially Note that insurance prior authorization may be required for reimbursement for recommended care.  Comment: Rehab Admissions Coordinator to follow up.   I have personally performed a face to face diagnostic evaluation, including, but not limited to relevant history and physical exam findings, of this patient and developed relevant assessment and plan.  Additionally, I have reviewed and concur with the physician assistant's documentation above.   Maryla Morrow, MD, ABPMR Mcarthur Rossetti Angiulli, PA-C 02/13/2018
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
## 2018-02-13 NOTE — Plan of Care (Signed)
Patient stable, discussed POC with patient, agreeable with plan, denies question/concerns at this time.  

## 2018-02-13 NOTE — Progress Notes (Signed)
Physical therapy evaluated patient at around 1:52pm and during their session patient felt lightheaded and dizzy.  They recommended patient for SNF however he declined.  I personally spoke to the patient and he is willing to stay at the hospital for the night.  Home health order is placed and awaiting case managent work-up.
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
## 2018-02-13 NOTE — Progress Notes (Addendum)
Inpatient Rehabilitation  Please see consult by Dr. Allena KatzPatel for full details; patient does not meet medical necessity to warrant an IP Rehab stay.  Discussed with nurse case manager.  Will sign off at this time.  Call if questions.   Charlane FerrettiMelissa Taishawn Smaldone, M.A., CCC/SLP Admission Coordinator  [facility]  Cell [phone]
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
## 2018-02-14 DIAGNOSIS — G8911 Acute pain due to trauma: Secondary | ICD-10-CM

## 2018-02-14 DIAGNOSIS — Z9884 Bariatric surgery status: Secondary | ICD-10-CM

## 2018-02-14 DIAGNOSIS — M5412 Radiculopathy, cervical region: Secondary | ICD-10-CM

## 2018-02-14 DIAGNOSIS — F339 Major depressive disorder, recurrent, unspecified: Secondary | ICD-10-CM

## 2018-02-14 DIAGNOSIS — H5462 Unqualified visual loss, left eye, normal vision right eye: Secondary | ICD-10-CM

## 2018-02-14 DIAGNOSIS — E611 Iron deficiency: Secondary | ICD-10-CM

## 2018-02-14 DIAGNOSIS — Z7982 Long term (current) use of aspirin: Secondary | ICD-10-CM

## 2018-02-14 DIAGNOSIS — Z7901 Long term (current) use of anticoagulants: Secondary | ICD-10-CM

## 2018-02-14 DIAGNOSIS — Z79899 Other long term (current) drug therapy: Secondary | ICD-10-CM

## 2018-02-14 DIAGNOSIS — E538 Deficiency of other specified B group vitamins: Secondary | ICD-10-CM

## 2018-02-14 DIAGNOSIS — G40909 Epilepsy, unspecified, not intractable, without status epilepticus: Principal | ICD-10-CM

## 2018-02-14 DIAGNOSIS — I4891 Unspecified atrial fibrillation: Secondary | ICD-10-CM

## 2018-02-14 DIAGNOSIS — I509 Heart failure, unspecified: Secondary | ICD-10-CM

## 2018-02-14 DIAGNOSIS — I69854 Hemiplegia and hemiparesis following other cerebrovascular disease affecting left non-dominant side: Secondary | ICD-10-CM

## 2018-02-14 DIAGNOSIS — M25551 Pain in right hip: Secondary | ICD-10-CM

## 2018-02-14 DIAGNOSIS — Z950 Presence of cardiac pacemaker: Secondary | ICD-10-CM

## 2018-02-14 DIAGNOSIS — D649 Anemia, unspecified: Secondary | ICD-10-CM

## 2018-02-14 DIAGNOSIS — G8929 Other chronic pain: Secondary | ICD-10-CM

## 2018-02-14 DIAGNOSIS — E785 Hyperlipidemia, unspecified: Secondary | ICD-10-CM

## 2018-02-14 DIAGNOSIS — M454 Ankylosing spondylitis of thoracic region: Secondary | ICD-10-CM

## 2018-02-14 DIAGNOSIS — R109 Unspecified abdominal pain: Secondary | ICD-10-CM

## 2018-02-14 LAB — CBC
HCT: 34.4 % — ABNORMAL LOW (ref 39.0–52.0)
Hemoglobin: 10.4 g/dL — ABNORMAL LOW (ref 13.0–17.0)
MCH: 22.8 pg — ABNORMAL LOW (ref 26.0–34.0)
MCHC: 30.2 g/dL (ref 30.0–36.0)
MCV: 75.4 fL — ABNORMAL LOW (ref 78.0–100.0)
PLATELETS: 200 10*3/uL (ref 150–400)
RBC: 4.56 MIL/uL (ref 4.22–5.81)
RDW: 17 % — AB (ref 11.5–15.5)
WBC: 6.1 10*3/uL (ref 4.0–10.5)
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
## 2018-02-14 NOTE — Care Management Note (Signed)
Case Management Note  Patient Details  Name: Richard Benson MRN: [id number] Date of Birth: 11/19/1975  Subjective/Objective:                    Action/Plan: [facility]. CM provided the patient with information on Medicaid transportation since patient to not drive after d/c. Pt states his wife will transport him home today.   Expected Discharge Date:  02/14/18               Expected Discharge Plan:  Home w Home Health Services  In-House Referral:     Discharge planning Services  CM Consult  Post Acute Care Choice:  Home Health Choice offered to:  Patient  DME Arranged:    DME Agency:     HH Arranged:  RN, PT, OT, Nurse's Aide HH Agency:  [facility]  Status of Service:  Completed, signed off  If discussed at Long Length of Stay Meetings, dates discussed:    Additional Comments:  [name], RN 02/14/2018, 1:45 PM

## 2018-02-14 NOTE — Progress Notes (Signed)
Patient discharged home. Discharge instructions were reviewed with patient. Patient verbalized understanding.  

## 2018-02-14 NOTE — Progress Notes (Signed)
  Date: 02/14/2018  Patient name: Richard Benson  Medical record number: [id number]  Date of birth: 01/21/1976   I have seen and evaluated this patient and I have discussed the plan of care with the house staff. Please see their note for complete details. I concur with their findings with the following additions/corrections: Richard Benson was seen on AM rounds with team. Pt's D/C was to be yesterday but had dizziness and difficulty with PT and the D/C was cancelled. Pt's neuro exam per Dr Dortha SchwalbeAgyei improved today. Pt feels better except for cont HA and hand numbness. We have asked care mgmt to assist with medicaid transport and assistance. Anticipate D/C later today if stable.  Burns SpainButcher, Nessie Nong A, MD 02/14/2018, 1:22 PM
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
## 2018-02-15 ENCOUNTER — Encounter (HOSPITAL_COMMUNITY): Payer: Self-pay | Admitting: Emergency Medicine

## 2018-02-15 LAB — BASIC METABOLIC PANEL
Anion gap: 2 — ABNORMAL LOW (ref 5–15)
BUN: 6 mg/dL (ref 6–20)
CALCIUM: 8.8 mg/dL — AB (ref 8.9–10.3)
CO2: 30 mmol/L (ref 22–32)
CREATININE: 0.76 mg/dL (ref 0.61–1.24)
Chloride: 108 mmol/L (ref 98–111)
Glucose, Bld: 100 mg/dL — ABNORMAL HIGH (ref 70–99)
Potassium: 4.1 mmol/L (ref 3.5–5.1)
SODIUM: 140 mmol/L (ref 135–145)

## 2018-05-31 ENCOUNTER — Encounter (INDEPENDENT_AMBULATORY_CARE_PROVIDER_SITE_OTHER): Payer: Medicaid Other | Admitting: Ophthalmology

## 2018-06-13 ENCOUNTER — Encounter (INDEPENDENT_AMBULATORY_CARE_PROVIDER_SITE_OTHER): Payer: MEDICAID | Admitting: Ophthalmology

## 2018-06-13 ENCOUNTER — Encounter (INDEPENDENT_AMBULATORY_CARE_PROVIDER_SITE_OTHER): Payer: Medicaid Other | Admitting: Ophthalmology

## 2018-06-16 DIAGNOSIS — R109 Unspecified abdominal pain: Secondary | ICD-10-CM | POA: Diagnosis not present

## 2018-06-16 DIAGNOSIS — Z765 Malingerer [conscious simulation]: Secondary | ICD-10-CM

## 2018-06-16 DIAGNOSIS — G8929 Other chronic pain: Secondary | ICD-10-CM

## 2018-06-16 DIAGNOSIS — M6281 Muscle weakness (generalized): Secondary | ICD-10-CM

## 2018-06-16 DIAGNOSIS — Z8673 Personal history of transient ischemic attack (TIA), and cerebral infarction without residual deficits: Secondary | ICD-10-CM

## 2018-06-16 DIAGNOSIS — R51 Headache: Secondary | ICD-10-CM

## 2018-06-16 DIAGNOSIS — K5900 Constipation, unspecified: Secondary | ICD-10-CM | POA: Diagnosis not present

## 2018-06-16 DIAGNOSIS — K625 Hemorrhage of anus and rectum: Secondary | ICD-10-CM

## 2018-06-16 DIAGNOSIS — G459 Transient cerebral ischemic attack, unspecified: Secondary | ICD-10-CM | POA: Diagnosis not present

## 2018-06-16 DIAGNOSIS — R071 Chest pain on breathing: Secondary | ICD-10-CM | POA: Diagnosis not present

## 2018-06-16 DIAGNOSIS — K209 Esophagitis, unspecified: Secondary | ICD-10-CM

## 2018-06-16 DIAGNOSIS — K5730 Diverticulosis of large intestine without perforation or abscess without bleeding: Secondary | ICD-10-CM

## 2018-06-17 DIAGNOSIS — G8929 Other chronic pain: Secondary | ICD-10-CM | POA: Diagnosis not present

## 2018-06-17 DIAGNOSIS — R109 Unspecified abdominal pain: Secondary | ICD-10-CM | POA: Diagnosis not present

## 2018-06-17 DIAGNOSIS — K625 Hemorrhage of anus and rectum: Secondary | ICD-10-CM | POA: Diagnosis not present

## 2018-06-17 DIAGNOSIS — K5900 Constipation, unspecified: Secondary | ICD-10-CM | POA: Diagnosis not present

## 2018-06-26 ENCOUNTER — Encounter (INDEPENDENT_AMBULATORY_CARE_PROVIDER_SITE_OTHER): Payer: Medicaid Other | Admitting: Ophthalmology

## 2018-06-26 DIAGNOSIS — H47212 Primary optic atrophy, left eye: Secondary | ICD-10-CM

## 2018-06-26 DIAGNOSIS — H31002 Unspecified chorioretinal scars, left eye: Secondary | ICD-10-CM | POA: Diagnosis not present

## 2018-06-26 DIAGNOSIS — H43813 Vitreous degeneration, bilateral: Secondary | ICD-10-CM

## 2018-06-26 DIAGNOSIS — H338 Other retinal detachments: Secondary | ICD-10-CM | POA: Diagnosis not present

## 2018-08-28 ENCOUNTER — Ambulatory Visit: Payer: Medicaid Other | Admitting: Cardiology

## 2018-08-28 DIAGNOSIS — Z950 Presence of cardiac pacemaker: Secondary | ICD-10-CM | POA: Insufficient documentation

## 2018-08-28 NOTE — Progress Notes (Deleted)
Cardiology Office Note:    Date:  08/28/2018   ID:  Richard Benson, DOB 01/11/1976, MRN [id number]  PCP:  System, Pcp Not In  Cardiologist:  Norman Herrlich , MD    Referring MD: No ref. provider found    ASSESSMENT:    1. Paroxysmal atrial fibrillation (HCC)   2. Chronic diastolic heart failure (HCC)   3. SSS (sick sinus syndrome) s/p pacemaker 2018 (HCC)   4. Pacemaker    PLAN:    In order of problems listed above:  1. ***   Next appointment: ***   Medication Adjustments/Labs and Tests Ordered: Current medicines are reviewed at length with the patient today.  Concerns regarding medicines are outlined above.  No orders of the defined types were placed in this encounter.  No orders of the defined types were placed in this encounter.   No chief complaint on file.   History of Present Illness:    Richard Benson is a 43 y.o. male with a hx of morbid obesity diastolic heart failure paroxysmal atrial fibrillation stroke sick sinus syndrome bradycardia and permanent pacemaker last [facility] by [facility].  At that time he had improved his ejection fraction had normalized he was anticoagulated and was followed in device clinic through my previous practice.  The pacemaker was placed in 2014 he has a history of gastric bypass surgery for obesity and has had normal coronary arteriography when he had heart failure initially decompensated with a reduced ejection fraction. Compliance with diet, lifestyle and medications: ***  He was recently at [facility] [phone] with complaints of abdominal pain and rectal bleeding.  During that hospitalization he was felt to be stable concern was raised regarding a narcotic withdrawal syndrome and he had no cardiology complications.  His discharge hemoglobin was 11.8 renal function was normal creatinine 0.7 potassium 4.2 proBNP level was not assessed.  Echocardiogram was performed showing normal left  ventricular size and function ejection fraction 55 to 60% and he had a contrast echo with no evidence of shunt.  Carotid duplex was performed interpreted as normal CT of the head was performed which was interpreted as normal CT angiogram of the chest abdomen and pelvis was performed which showed a small hiatal hernia and diffuse esophageal thickening felt to be due to esophagitis.  At discharge from the hospital he was treated with Prilosec and Carafate for his esophagitis he was prescribed torsemide to take as needed for edema was on oral iron and was not anticoagulated to have been stopped previously because of anemia.  He has a background history of stroke diastolic heart failure sick sinus syndrome and bradycardia and paroxysmal atrial fibrillation.  [facility] device [facility] and X chromosome showed normal device function and download with occasional episodes of atrial tachycardia versus sinus tachycardia the individual strips are not available for evaluation.  Time his last cardiology evaluation 03/16/2018 he was on Xarelto anticoagulant.  Past Medical History:  Diagnosis Date  . Blind left eye   . CHF (congestive heart failure) (HCC)   . Obesity   . Stroke [facility](HCC)     Past Surgical History:  Procedure Laterality Date  . EYE SURGERY      Current Medications: No outpatient medications have been marked as taking for the 08/28/18 encounter (Appointment) with Baldo Daub,  J, MD.     Allergies:   Patient has no known allergies.   Social History  Socioeconomic History  . Marital status: Single    Spouse name: Not on file  . Number of children: Not on file  . Years of education: Not on file  . Highest education level: Not on file  Occupational History  . Not on file  Social Needs  . Financial resource strain: Not on file  . Food insecurity:    Worry: Not on file    Inability: Not on file  . Transportation needs:    Medical: Not on file     Non-medical: Not on file  Tobacco Use  . Smoking status: Never Smoker  Substance and Sexual Activity  . Alcohol use: No    Frequency: Never  . Drug use: No  . Sexual activity: Not on file  Lifestyle  . Physical activity:    Days per week: Not on file    Minutes per session: Not on file  . Stress: Not on file  Relationships  . Social connections:    Talks on phone: Not on file    Gets together: Not on file    Attends religious service: Not on file    Active member of club or organization: Not on file    Attends meetings of clubs or organizations: Not on file    Relationship status: Not on file  Other Topics Concern  . Not on file  Social History Narrative   ** Merged History Encounter **       ** Merged History Encounter **         Family History: The patient's ***family history is not on file. ROS:   Please see the history of present illness.    All other systems reviewed and are negative.  EKGs/Labs/Other Studies Reviewed:    The following studies were reviewed today:  EKG:  EKG ordered today.  The ekg ordered today demonstrates ***  Recent Labs: 11/04/2017: Magnesium 2.4 02/11/2018: ALT 13 02/14/2018: BUN 6; Creatinine, Ser 0.76; Hemoglobin 10.4; Platelets 200; Potassium 4.1; Sodium 140  Recent Lipid Panel    Component Value Date/Time   CHOL 131 11/05/2017 0629   TRIG 76 11/05/2017 0629   HDL 40 (L) 11/05/2017 0629   CHOLHDL 3.3 11/05/2017 0629   VLDL 15 11/05/2017 0629   LDLCALC 76 11/05/2017 0629    Physical Exam:    VS:  There were no vitals taken for this visit.    Wt Readings from Last 3 Encounters:  02/14/18 252 lb 6.8 oz (114.5 kg)  11/04/17 254 lb 3.1 oz (115.3 kg)  09/04/17 253 lb (114.8 kg)     GEN: *** Well nourished, well developed in no acute distress HEENT: Normal NECK: No JVD; No carotid bruits LYMPHATICS: No lymphadenopathy CARDIAC: ***RRR, no murmurs, rubs, gallops RESPIRATORY:  Clear to auscultation without rales, wheezing or  rhonchi  ABDOMEN: Soft, non-tender, non-distended MUSCULOSKELETAL:  No edema; No deformity  SKIN: Warm and dry NEUROLOGIC:  Alert and oriented x 3 PSYCHIATRIC:  Normal affect    Signed, Norman Herrlich, MD  08/28/2018 8:40 AM    [facility]

## 2019-06-24 DIAGNOSIS — R079 Chest pain, unspecified: Secondary | ICD-10-CM

## 2019-06-25 DIAGNOSIS — R079 Chest pain, unspecified: Secondary | ICD-10-CM | POA: Diagnosis not present

## 2021-04-23 ENCOUNTER — Emergency Department (HOSPITAL_COMMUNITY)
Admission: EM | Admit: 2021-04-23 | Discharge: 2021-04-23 | Disposition: A | Discharge status: Home / Self Care | Payer: Medicaid Other | Attending: Emergency Medicine | Admitting: Emergency Medicine

## 2021-04-23 ENCOUNTER — Emergency Department (HOSPITAL_COMMUNITY): Payer: Medicaid Other

## 2021-04-23 ENCOUNTER — Encounter (HOSPITAL_COMMUNITY): Payer: Self-pay

## 2021-04-23 ENCOUNTER — Other Ambulatory Visit: Payer: Self-pay

## 2021-04-23 DIAGNOSIS — Z7982 Long term (current) use of aspirin: Secondary | ICD-10-CM | POA: Diagnosis not present

## 2021-04-23 DIAGNOSIS — Z950 Presence of cardiac pacemaker: Secondary | ICD-10-CM | POA: Insufficient documentation

## 2021-04-23 DIAGNOSIS — F444 Conversion disorder with motor symptom or deficit: Secondary | ICD-10-CM | POA: Diagnosis not present

## 2021-04-23 DIAGNOSIS — Z87891 Personal history of nicotine dependence: Secondary | ICD-10-CM | POA: Diagnosis not present

## 2021-04-23 DIAGNOSIS — I639 Cerebral infarction, unspecified: Secondary | ICD-10-CM | POA: Diagnosis not present

## 2021-04-23 DIAGNOSIS — I5032 Chronic diastolic (congestive) heart failure: Secondary | ICD-10-CM | POA: Insufficient documentation

## 2021-04-23 DIAGNOSIS — M5481 Occipital neuralgia: Secondary | ICD-10-CM | POA: Diagnosis not present

## 2021-04-23 DIAGNOSIS — Z7901 Long term (current) use of anticoagulants: Secondary | ICD-10-CM | POA: Diagnosis not present

## 2021-04-23 DIAGNOSIS — I110 Hypertensive heart disease with heart failure: Secondary | ICD-10-CM | POA: Insufficient documentation

## 2021-04-23 DIAGNOSIS — G43419 Hemiplegic migraine, intractable, without status migrainosus: Secondary | ICD-10-CM | POA: Diagnosis not present

## 2021-04-23 DIAGNOSIS — R0600 Dyspnea, unspecified: Secondary | ICD-10-CM

## 2021-04-23 DIAGNOSIS — R531 Weakness: Secondary | ICD-10-CM | POA: Diagnosis present

## 2021-04-23 LAB — DIFFERENTIAL
Abs Immature Granulocytes: 0.02 10*3/uL (ref 0.00–0.07)
Basophils Absolute: 0.1 10*3/uL (ref 0.0–0.1)
Basophils Relative: 1 %
Eosinophils Absolute: 0.1 10*3/uL (ref 0.0–0.5)
Eosinophils Relative: 2 %
Immature Granulocytes: 0 %
Lymphocytes Relative: 37 %
Lymphs Abs: 2.1 10*3/uL (ref 0.7–4.0)
Monocytes Absolute: 0.5 10*3/uL (ref 0.1–1.0)
Monocytes Relative: 9 %
Neutro Abs: 2.9 10*3/uL (ref 1.7–7.7)
Neutrophils Relative %: 51 %

## 2021-04-23 LAB — CBC
HCT: 40.8 % (ref 39.0–52.0)
Hemoglobin: 13.6 g/dL (ref 13.0–17.0)
MCH: 28.9 pg (ref 26.0–34.0)
MCHC: 33.3 g/dL (ref 30.0–36.0)
MCV: 86.6 fL (ref 80.0–100.0)
Platelets: 202 10*3/uL (ref 150–400)
RBC: 4.71 MIL/uL (ref 4.22–5.81)
RDW: 14.3 % (ref 11.5–15.5)
WBC: 5.6 10*3/uL (ref 4.0–10.5)
nRBC: 0 % (ref 0.0–0.2)

## 2021-04-23 LAB — PROTIME-INR
INR: 1 (ref 0.8–1.2)
Prothrombin Time: 12.6 seconds (ref 11.4–15.2)

## 2021-04-23 LAB — COMPREHENSIVE METABOLIC PANEL
ALT: 20 U/L (ref 0–44)
AST: 31 U/L (ref 15–41)
Albumin: 3 g/dL — ABNORMAL LOW (ref 3.5–5.0)
Alkaline Phosphatase: 41 U/L (ref 38–126)
Anion gap: 6 (ref 5–15)
BUN: 9 mg/dL (ref 6–20)
CO2: 29 mmol/L (ref 22–32)
Calcium: 8.2 mg/dL — ABNORMAL LOW (ref 8.9–10.3)
Chloride: 101 mmol/L (ref 98–111)
Creatinine, Ser: 0.7 mg/dL (ref 0.61–1.24)
GFR, Estimated: >60 mL/min (ref >60)
Glucose, Bld: 84 mg/dL (ref 70–99)
Potassium: 3.4 mmol/L — ABNORMAL LOW (ref 3.5–5.1)
Sodium: 136 mmol/L (ref 135–145)
Total Bilirubin: 0.6 mg/dL (ref 0.3–1.2)
Total Protein: 5.5 g/dL — ABNORMAL LOW (ref 6.5–8.1)

## 2021-04-23 LAB — I-STAT CHEM 8, ED
BUN: 9 mg/dL (ref 6–20)
Calcium, Ion: 1.04 mmol/L — ABNORMAL LOW (ref 1.15–1.40)
Chloride: 101 mmol/L (ref 98–111)
Creatinine, Ser: 0.7 mg/dL (ref 0.61–1.24)
Glucose, Bld: 77 mg/dL (ref 70–99)
HCT: 38 % — ABNORMAL LOW (ref 39.0–52.0)
Hemoglobin: 12.9 g/dL — ABNORMAL LOW (ref 13.0–17.0)
Potassium: 3.5 mmol/L (ref 3.5–5.1)
Sodium: 137 mmol/L (ref 135–145)
TCO2: 27 mmol/L (ref 22–32)

## 2021-04-23 LAB — CBG MONITORING, ED: Glucose-Capillary: 79 mg/dL (ref 70–99)

## 2021-04-23 LAB — APTT: aPTT: 25 seconds (ref 24–36)

## 2021-04-23 MED ORDER — IOHEXOL 350 MG/ML SOLN
60.0000 mL | Freq: Once | INTRAVENOUS | Status: AC | PRN
  Administered 2021-04-23: 60 mL via INTRAVENOUS

## 2021-04-23 MED ORDER — OXYCODONE-ACETAMINOPHEN 5-325 MG PO TABS
2.0000 | ORAL_TABLET | Freq: Once | ORAL | Status: AC
Start: 2021-04-23 — End: 2021-04-23
  Administered 2021-04-23: 2 via ORAL
  Filled 2021-04-23: qty 2

## 2021-04-23 MED ORDER — BUPIVACAINE HCL 0.5 % IJ SOLN
20.0000 mL | Freq: Once | INTRAMUSCULAR | Status: AC
  Administered 2021-04-23: 20 mL
  Filled 2021-04-23: qty 20

## 2021-04-23 MED ORDER — ACETAMINOPHEN 500 MG PO TABS
1000.0000 mg | ORAL_TABLET | Freq: Once | ORAL | Status: AC
  Administered 2021-04-23: 1000 mg via ORAL
  Filled 2021-04-23: qty 2

## 2021-04-23 MED ORDER — SODIUM CHLORIDE 0.9% FLUSH
3.0000 mL | Freq: Once | INTRAVENOUS | Status: DC
Start: 2021-04-23 — End: 2021-04-24

## 2021-04-23 MED ORDER — BUPIVACAINE HCL (PF) 0.25 % IJ SOLN: 10.0000 mL | Freq: Once | INTRAMUSCULAR | Status: DC

## 2021-04-23 NOTE — Consult Note (Addendum)
Neurology Consultation  Reason for Consult: Left-sided weakness, left-sided decreased sensation, headache Referring Physician: Dr. Wilkie Aye  CC: Right temporal headache, chest pain  History is obtained from: Patient, EMS, chart review  HPI: Richard Benson is a 45 y.o. male with a medical history significant for atrial fibrillation on Xarelto with history of nonadherence, permanent pacemaker, CHG, depression, significant weight loss s/p gastric bypass procedure, multiple episodes of left-sided weakness in the past with concern for TIAs versus hemiplegic migraines, seizures on Keppra, and possible traumatic subarachnoid hemorrhage versus artifact who presented to the ED from jail today for evaluation of left-sided weakness, decreased sensation on the left, and right temporal headache. Patient was placed in jail on Saturday, September 10 and reports that he has not received his medications including his Xarelto or Keppra since he has been there. Per EMS, there were multiple different stories provided about events leading up to presentation but as best that they could tell, Mr. Victorine was witnessed in the jail by other inmates and staff to have a brief syncopal episode lasting approximately 10 seconds before complaining of a headache and acute left-sided deficits. Mr. Takashima does not recall a syncopal episode, he states that he was digging in a ditch with his grandson prior to arrival and then next thing he recalled was being surrounded by EMTs. With further discussion, the sheriff accompanying the patient states that Mr. Gwynn had complained of left shoulder and arm tingling yesterday and was encouraged to contact medical but patient did not at that time. On presentation, he complains of a right temporal headache that radiates behind his right ear and into his neck with extreme discomfort with palpation.    On chart review, patient has had multiple similar presentations in the past with  left-sided weakness and left-sided sensory deficits with at least 4 presentations in 2019 with CT head and vessel imaging negative. He does not have MRI brain imaging to review due to his PPM. Patient has a repeated history of presentations involving right-sided headaches that travel down his head and to his left side with left arm numbness and weakness that can last hours to weeks with episodes of "blacking out" similar to his current presentation. He has been prescribed topiramate though he did not feel that it helped so he stopped taking it but he has had improvement in symptoms in the past with administration of migraine cocktails. He states that in June of this year, he was admitted to [facility] hospital for evaluation of a stroke with right temporal headache and left sided weakness that lasted approximately one week and resolved after therapy. He did not receive thrombolytic therapy at that time.   LKW: Unclear, possibly sometime yesterday versus 11:30 this morning TNK given?: no, patient examination waxes and wanes with giveway weakness with low suspicion for acute stroke. Also, the last known well time is unclear and further information suggests that he may have had symptom onset yesterday. IR Thrombectomy? No, vessel imaging reviewed by attending MD without evidence of LVO. Modified Rankin Scale: 1-No significant post stroke disability and can perform usual duties with stroke symptoms  ROS: A complete ROS was performed and is negative except as noted in the HPI.   Past Medical History:  Diagnosis Date   Blind left eye    CHF (congestive heart failure) (HCC)    Obesity    Stroke [facility])    History reviewed. No pertinent family history.  Social History:   reports that he has quit smoking. His  smoking use included cigarettes. He has never used smokeless tobacco. He reports that he does not drink alcohol and does not use drugs.  Medications  Current Facility-Administered Medications:    sodium  chloride flush (NS) 0.9 % injection 3 mL, 3 mL, Intravenous, Once, Horton, Kristie M, DO  Current Outpatient Medications:    acetaminophen (TYLENOL) 500 MG tablet, Take 500-1,000 mg by mouth every 6 (six) hours as needed (for pain)., Disp: , Rfl:    aspirin EC 81 MG tablet, Take 1 tablet (81 mg total) by mouth daily., Disp: 30 tablet, Rfl: 6   aspirin EC 81 MG tablet, Take 81 mg by mouth daily., Disp: , Rfl:    atorvastatin (LIPITOR) 40 MG tablet, Take 1 tablet (40 mg total) by mouth daily at 6 PM., Disp: 30 tablet, Rfl: 6   atorvastatin (LIPITOR) 40 MG tablet, Take 40 mg by mouth daily at 6 PM., Disp: , Rfl:    celecoxib (CELEBREX) 200 MG capsule, Take 200 mg by mouth 2 (two) times daily., Disp: , Rfl:    celecoxib (CELEBREX) 200 MG capsule, Take 200 mg by mouth 2 (two) times daily., Disp: , Rfl: 0   Cholecalciferol (VITAMIN D3) 5000 units CAPS, Take 5,000 Units by mouth daily., Disp: , Rfl:    citalopram (CELEXA) 20 MG tablet, Take 20 mg by mouth daily., Disp: , Rfl:    citalopram (CELEXA) 20 MG tablet, Take 20 mg by mouth daily., Disp: , Rfl:    Cyanocobalamin (VITAMIN B-12 PO), Take 1 tablet by mouth daily., Disp: , Rfl:    diclofenac sodium (VOLTAREN) 1 % GEL, Apply 2 g topically See admin instructions. Apply 2 grams to affected areas four times a day, Disp: , Rfl:    diclofenac sodium (VOLTAREN) 1 % GEL, Apply 2 g topically 4 (four) times daily., Disp: , Rfl:    ferrous sulfate 325 (65 FE) MG tablet, Take 1 tablet (325 mg total) by mouth daily., Disp: 30 tablet, Rfl: 3   gabapentin (NEURONTIN) 100 MG capsule, Take 200 mg by mouth 3 (three) times daily. , Disp: , Rfl:    gabapentin (NEURONTIN) 100 MG capsule, Take 200 mg by mouth 3 (three) times daily., Disp: , Rfl:    levETIRAcetam (KEPPRA) 750 MG tablet, Take 1 tablet (750 mg total) by mouth 2 (two) times daily., Disp: 60 tablet, Rfl: 6   levETIRAcetam (KEPPRA) 750 MG tablet, Take 1 tablet (750 mg total) by mouth 2 (two) times daily.,  Disp: 60 tablet, Rfl: 1   Multiple Vitamin (MULTIVITAMIN WITH MINERALS) TABS tablet, Take 1 tablet by mouth daily., Disp: , Rfl:    Multiple Vitamin (MULTIVITAMIN WITH MINERALS) TABS tablet, Take 1 tablet by mouth daily., Disp: , Rfl:    nitroGLYCERIN (NITROSTAT) 0.4 MG SL tablet, Place 0.4 mg under the tongue every 5 (five) minutes as needed for chest pain. , Disp: , Rfl:    nitroGLYCERIN (NITROSTAT) 0.4 MG SL tablet, Place 0.4 mg under the tongue every 5 (five) minutes as needed for chest pain., Disp: , Rfl:    oxyCODONE ER (XTAMPZA ER) 18 MG C12A, Take 18 mg by mouth every 12 (twelve) hours., Disp: , Rfl:    oxyCODONE ER (XTAMPZA ER) 18 MG C12A, Take 18 mg by mouth every 12 (twelve) hours., Disp: , Rfl:    oxyCODONE-acetaminophen (PERCOCET) 10-325 MG tablet, Take 1 tablet by mouth every 6 (six) hours as needed for pain. , Disp: , Rfl:    oxyCODONE-acetaminophen (PERCOCET) 10-325 MG tablet,  Take 1 tablet by mouth every 8 (eight) hours as needed for pain. , Disp: , Rfl:    pantoprazole (PROTONIX) 40 MG tablet, Take 40 mg by mouth 2 (two) times daily., Disp: , Rfl:    pantoprazole (PROTONIX) 40 MG tablet, Take 40 mg by mouth 2 (two) times daily., Disp: , Rfl:    rivaroxaban (XARELTO) 20 MG TABS tablet, Take 1 tablet (20 mg total) by mouth daily with supper., Disp: 30 tablet, Rfl:    rivaroxaban (XARELTO) 20 MG TABS tablet, Take 20 mg by mouth daily with supper., Disp: , Rfl:    topiramate (TOPAMAX) 25 MG tablet, Take 25 mg by mouth 2 (two) times daily. , Disp: , Rfl:    topiramate (TOPAMAX) 25 MG tablet, Take 25 mg by mouth 2 (two) times daily., Disp: , Rfl:    torsemide (DEMADEX) 20 MG tablet, Take 20 mg by mouth See admin instructions. Take 20 mg by mouth every other day "as needed" for swelling, Disp: , Rfl:   Exam: Current vital signs: BP 110/77 (BP Location: Right Arm)   Pulse 60   Temp 98.1 F (36.7 C) (Oral)   Ht 5\' 11"  (1.803 m)   Wt 73.8 kg   SpO2 99%   BMI 22.69 kg/m  Vital  signs in last 24 hours: Temp:  [98.1 F (36.7 C)] 98.1 F (36.7 C) (09/15 1352) Pulse Rate:  [60] 60 (09/15 1352) BP: (110)/(77) 110/77 (09/15 1352) SpO2:  [99 %] 99 % (09/15 1352) Weight:  [73.8 kg] 73.8 kg (09/15 1353)  GENERAL: Awake, alert, in no acute distress Psych: Affect appropriate for situation, patient is calm and cooperative with examination Head: Normocephalic and atraumatic, without obvious abnormality. Patient winces and jumps with significant pain with palpation of right temporal scalp.  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 Non-labored breathing on room air CV: Irregular rate and rhythm on cardiac monitor ABDOMEN: Soft, non-tender, non-distended Extremities: warm, well perfused, without obvious deformity  NEURO:  Mental Status: Awake, alert, and oriented to person, place, time, and situation. He is amnestic to events leading up to hospitalization and provides a story that is much different than EMS report for presentation.  Speech/Language: speech is intact without dysarthria. Naming, fluency, and repetition are intact without aphasia.  No neglect is noted There is a functional component of presentation with inconsistent findings, increased deficits during formal evaluation with improvement with distraction. He is able to maintain eye contact with examiner during prolonged conversation but cannot keep his eyes open to name objects. Strength improves with distraction with increasing weakness during formal assessment.  Cranial Nerves:  II: Right eye pupil is regular, round, 4 mm, and briskly reactive to light. Left pupil is irregular, round, 4 mm, and briskly reactive to light.  III, IV, VI: EOMI with left eye exotropia.  V: Sensation is intact to light touch and symmetrical to face. Blinks to threat throughout on the right (chronic left eye  blindness) VII: Face is symmetric resting and smiling.  VIII: Hearing is intact to voice IX, X: Palate elevation is symmetric. Phonation normal.  XI: Normal sternocleidomastoid and trapezius muscle strength XII: Tongue protrudes midline without fasciculations.   Motor: Strength assessment is limited with largely suspected functional component.  Patient has left-sided weakness that increases with formal evaluation and waxes and wanes with improvement with distraction. Right upper and lower extremities 5/5 strength without vertical drift.  Left lower extremity initially is without movement but patient is able to strongly resist passive elevation. On reassessment, he attempts antigravity movement but is unable to lift leg off of the bed.  Left upper extremity strength varies from immediate drift to bed, to slow drift, to elevation without drift during distraction with immediate drift when attention is called to his strength.  Tone is normal. Bulk is normal.  Sensation: Intact to light touch in right upper and lower extremities with reported decreased sensation to light touch on the left upper and lower extremities.  Coordination: FTN and HKS intact on the right, unable to assess on the left due to weakness.  DTRs: 1+ and symmetric patellae and biceps Plantars: Toes downgoing bilaterally  Gait: Deferred  NIHSS: 1a Level of Conscious.: 0 1b LOC Questions: 0 1c LOC Commands: 0 2 Best Gaze: 0 3 Visual: 3; chronic left eye blindness 4 Facial Palsy: 0 5a Motor Arm - left: 2 5b Motor Arm - Right: 0 6a Motor Leg - Left: 3 6b Motor Leg - Right: 0 7 Limb Ataxia: 0 8 Sensory: 1 9 Best Language: 0 10 Dysarthria: 0 11 Extinct. and Inatten.: 0 TOTAL: 9  Labs I have reviewed labs in epic and the results pertinent to this consultation are: CBC    Component Value Date/Time   WBC 5.6 04/23/2021 1320   RBC 4.71 04/23/2021 1320   HGB 12.9 (L) 04/23/2021 1327   HCT 38.0 (L) 04/23/2021 1327   PLT  202 04/23/2021 1320   MCV 86.6 04/23/2021 1320   MCH 28.9 04/23/2021 1320   MCHC 33.3 04/23/2021 1320   RDW 14.3 04/23/2021 1320   LYMPHSABS 2.1 04/23/2021 1320   MONOABS 0.5 04/23/2021 1320   EOSABS 0.1 04/23/2021 1320   BASOSABS 0.1 04/23/2021 1320   CMP     Component Value Date/Time   NA 137 04/23/2021 1327   K 3.5 04/23/2021 1327   CL 101 04/23/2021 1327   CO2 29 04/23/2021 1320   GLUCOSE 77 04/23/2021 1327   BUN 9 04/23/2021 1327   CREATININE 0.70 04/23/2021 1327   CALCIUM 8.2 (L) 04/23/2021 1320   PROT 5.5 (L) 04/23/2021 1320   ALBUMIN 3.0 (L) 04/23/2021 1320   AST 31 04/23/2021 1320   ALT 20 04/23/2021 1320   ALKPHOS 41 04/23/2021 1320   BILITOT 0.6 04/23/2021 1320   GFRNONAA >60 04/23/2021 1320   GFRAA >60 02/14/2018 0321   Lipid Panel     Component Value Date/Time   CHOL 131 11/05/2017 0629   TRIG 76 11/05/2017 0629   HDL 40 (L) 11/05/2017 0629   CHOLHDL 3.3 11/05/2017 0629   VLDL 15 11/05/2017 0629   LDLCALC 76 11/05/2017 0629   Lab Results  Component Value Date   HGBA1C 5.1 11/05/2017   Imaging I have reviewed the images obtained:  CT-scan of the brain 9/15: No evidence of acute large vascular territory infarct or acute hemorrhage. ASPECTS is 10.  CT angio head and neck 9/15: No large vessel occlusion or proximal hemodynamically significant stenosis in the head or neck.  Assessment: 45 y.o. male who presented to the ED 9/15 from jail for evaluation of right temporal headache and left sided weakness following a reported brief 10 second syncopal episode. It is unclear if the patient truly synopsized at the jail and EMS reports inconsistent reporting.  - Examination reveals patient with left-sided weakness, decreased sensation of the left upper and lower extremities, and right temporal headache. His NIHSS is 9 due  to chronic left eye blindness. Strength assessment appears functional with waxing and waning qualities and significant differences in  presentation during formal assessment.  - Patient with a reported history of similar presentations in the past with transient symptoms, negative brain imaging, and concern for TIA versus hemiplegic migraines. There has documented improvement in symptoms following migraine cocktail administration.  - TNK was not given do to the inconsistencies in his examination and waxing and waning quality of symptoms. Also, it is possible that his symptoms started yesterday according to jail staff present at bedside recalling the patient complain of left upper extremity numbness on his shift yesterday. Patient presentation is not consistent with an LVO and vessel imaging was obtained without LVO or hemodynamically significant stenosis of the head or neck, therefore he is not a candidate for IR. - Stroke risk factors include history of AF and recent missed doses of Xarelto with possible history of TIAs in the past.  - Presentation is felt to be largely functional, however, stroke cannot be completely excluded. Patient is unable to obtain MRI imaging due to his PPM for further evaluation. Ddx includes hemiplegic migraine with history of such with the same presentation as today versus ophthalmic neuralgia due to significant pain elicited with right temporal scalp palpation.   Patient had strong functional overlay likely for secondary gain.  Recommendations: - MRI brain if able to obtain  - Occipital nerve trigger point injection complete with 10 mL 0.5% Bupivacaine injection - Stroke prophylaxis: continue home Xarelto  - Continue home Keppra  - Can consider admission/observation for treatment with migraine cocktail and monitor for improvement  - Please call neurology for questions.  Lanae Boast, AGAC-NP [facility] Pager: [phone]  Attending attestation: Patient seen, examined, labs,vitals and notes reviewed. Discussed plan with Lanae Boast, NP and agree with assessment and plan as  documented above. I have independently reviewed the chart, obtained history, review of systems and examined the patient.  Electronically signed by:  Marisue Humble, MD Page: [phone] 04/23/2021, 7:51 PM

## 2021-04-23 NOTE — Discharge Instructions (Signed)
Please give the patient the medications listed, which he is currently prescribed and was taking prior to incarceration.  The testing today does not show stroke.  In lieu of narcotics, please use Tylenol 650 mg every 4 hours or Motrin 400 mg every 8 hours for pain.

## 2021-04-23 NOTE — ED Provider Notes (Signed)
4:45 PM-checkout from Dr. Wilkie Aye to evaluate patient after trigger point injection by neurology.  Patient has been worked up for stroke, with negative findings so far.  He is unable to have MRI because of a pacemaker.  He has been incarcerated for the last 5 days so has not had his medications.  His medicines are at home.  He has been trying to get his medicines at the jail, but been unable to.  He is chronically on high-dose narcotics.   8:30 PM-patient states he has had no relief from the trigger point injection.  His current complaint is diffuse back pain, mostly lower.  He also states "I hurt all over..  At this time he has good grip strength bilaterally.  Eyes move together, but at rest his left eye rotates outward.  He has minimal vision in the left eye because of a old injury.  He states that he does not have a lens in that eye.  I assume seizure disorder and he states he rarely has seizures but may have had 1 in the remote past.  He is hoping to get out of jail soon, and states that he is there now because he missed a court date.   9:40 PM-patient is comfortable going home.  I discussed the findings with him.  He does not appear to have a stroke.  His weakness, is likely related to musculoskeletal problems and not CNS or spine disorders.  I doubt that he had a seizure today.  Unfortunately he is incarcerated and does not have access to his medicines.  I was able to contact the nurse at the jail, who assured me that they could start his medicines if they had an accurate list which I have included on the AVS.  These medicines have been verified by pharmacy team.  The jail will not give him narcotics so I recommended that they give him Tylenol or Motrin for pain.   Mancel Bale, MD 04/23/21 2142

## 2021-04-23 NOTE — ED Triage Notes (Signed)
Pt bib [employer] from prison as a code stroke, c/o headache for 2 days and left sided weakness, left side arm droop and  left side facial droop, slurred speech. LKN 1130. Hx stroke in June, on Xarelto but has not taken for 6 days. BP 132/60, CBG 119, axo x4. 16G RAC placed by EMS.

## 2021-04-23 NOTE — Procedures (Signed)
PRE-OP DIAGNOSIS: Right occipital neuralgia POST-OP DIAGNOSIS: Same  PROCEDURE: occipital nerve trigger point injection Performing Physician: Dr. Thomasena Edis with S. Toberman, NP  Dose:  10 mL 0.5% bupivacaine injection  Procedure: Procedure risks and benefits were explained to patient who verbalized understanding. Formal consent was obtained with MD and NP at bedside. The area was palpated, prepped, and then cleaned thoroughly with alcohol. The needle was inserted into the affected area and the bupivacaine was injected. There were no complications during this procedure and there was no blood loss.   Followup: The patient tolerated the procedure well without complications. Standard post-procedure care is explained and return  precautions are given.  Electronically signed by:  Marisue Humble, MD Page: [phone] 04/23/2021, 7:57 PM
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
## 2021-04-23 NOTE — Code Documentation (Signed)
Stroke Response Nurse Documentation Code Documentation  SHASHWAT CLEARY is a 45 y.o. male arriving to [facility] ED via [facility] on 04/23/21 with past medical hx of CVA, SAH, Hemiplegic Migraines, Seizures. On Xarelto (rivaroxaban) daily. Code stroke was activated by [facility].   [facility]W at [employer]. Reportedly had a syncopal episode, woke up with left sided weakness. Has history of CVA in June, 2022 and hemiplegic migraines. Takes Xarelto however, has not had a dose in 6-7 days since being incarcerated.   Stroke team at the bedside on patient arrival. Labs drawn and patient cleared for CT by EDP. Patient to CT with team. NIHSS 6, see documentation for details and code stroke times. Patient with left hemianopia, left arm weakness, and left leg weakness on exam. The following imaging was completed: CT, CTA head. Patient is not a candidate for IV Thrombolytic due to stroke not suspected. Patient is not a candidate for IR due to no large vessel occlusion seen on scans per Thomasena Edis, MD.    Bedside handoff with ED Thea Silversmith, RN.    Toniann Fail  Stroke Response RN

## 2022-06-11 ENCOUNTER — Inpatient Hospital Stay (HOSPITAL_COMMUNITY): Payer: Medicaid Other

## 2022-06-11 ENCOUNTER — Other Ambulatory Visit: Payer: Self-pay

## 2022-06-11 ENCOUNTER — Emergency Department (HOSPITAL_COMMUNITY): Payer: Medicaid Other

## 2022-06-11 ENCOUNTER — Encounter (HOSPITAL_COMMUNITY): Payer: Self-pay | Admitting: Radiology

## 2022-06-11 ENCOUNTER — Inpatient Hospital Stay (HOSPITAL_COMMUNITY)
Admission: EM | Admit: 2022-06-11 | Discharge: 2022-06-15 | DRG: 102 | Discharge status: Against Medical Advice | Payer: Medicaid Other | Attending: Neurology | Admitting: Neurology

## 2022-06-11 DIAGNOSIS — F411 Generalized anxiety disorder: Secondary | ICD-10-CM | POA: Diagnosis present

## 2022-06-11 DIAGNOSIS — Z86711 Personal history of pulmonary embolism: Secondary | ICD-10-CM

## 2022-06-11 DIAGNOSIS — G4733 Obstructive sleep apnea (adult) (pediatric): Secondary | ICD-10-CM | POA: Diagnosis present

## 2022-06-11 DIAGNOSIS — I959 Hypotension, unspecified: Secondary | ICD-10-CM | POA: Diagnosis not present

## 2022-06-11 DIAGNOSIS — Z79891 Long term (current) use of opiate analgesic: Secondary | ICD-10-CM

## 2022-06-11 DIAGNOSIS — Z7901 Long term (current) use of anticoagulants: Secondary | ICD-10-CM | POA: Diagnosis not present

## 2022-06-11 DIAGNOSIS — Z635 Disruption of family by separation and divorce: Secondary | ICD-10-CM

## 2022-06-11 DIAGNOSIS — E878 Other disorders of electrolyte and fluid balance, not elsewhere classified: Secondary | ICD-10-CM | POA: Diagnosis present

## 2022-06-11 DIAGNOSIS — Z5329 Procedure and treatment not carried out because of patient's decision for other reasons: Secondary | ICD-10-CM | POA: Diagnosis not present

## 2022-06-11 DIAGNOSIS — I509 Heart failure, unspecified: Secondary | ICD-10-CM | POA: Diagnosis present

## 2022-06-11 DIAGNOSIS — I10 Essential (primary) hypertension: Secondary | ICD-10-CM

## 2022-06-11 DIAGNOSIS — R569 Unspecified convulsions: Secondary | ICD-10-CM

## 2022-06-11 DIAGNOSIS — H5462 Unqualified visual loss, left eye, normal vision right eye: Secondary | ICD-10-CM | POA: Diagnosis present

## 2022-06-11 DIAGNOSIS — Z91199 Patient's noncompliance with other medical treatment and regimen due to unspecified reason: Secondary | ICD-10-CM | POA: Diagnosis not present

## 2022-06-11 DIAGNOSIS — I480 Paroxysmal atrial fibrillation: Secondary | ICD-10-CM | POA: Diagnosis present

## 2022-06-11 DIAGNOSIS — E43 Unspecified severe protein-calorie malnutrition: Secondary | ICD-10-CM | POA: Diagnosis present

## 2022-06-11 DIAGNOSIS — Z6824 Body mass index (BMI) 24.0-24.9, adult: Secondary | ICD-10-CM

## 2022-06-11 DIAGNOSIS — F449 Dissociative and conversion disorder, unspecified: Secondary | ICD-10-CM | POA: Diagnosis present

## 2022-06-11 DIAGNOSIS — Z7982 Long term (current) use of aspirin: Secondary | ICD-10-CM

## 2022-06-11 DIAGNOSIS — Z8673 Personal history of transient ischemic attack (TIA), and cerebral infarction without residual deficits: Secondary | ICD-10-CM | POA: Diagnosis not present

## 2022-06-11 DIAGNOSIS — D689 Coagulation defect, unspecified: Secondary | ICD-10-CM | POA: Diagnosis present

## 2022-06-11 DIAGNOSIS — Y92238 Other place in hospital as the place of occurrence of the external cause: Secondary | ICD-10-CM | POA: Diagnosis not present

## 2022-06-11 DIAGNOSIS — G8194 Hemiplegia, unspecified affecting left nondominant side: Secondary | ICD-10-CM | POA: Diagnosis present

## 2022-06-11 DIAGNOSIS — Z87891 Personal history of nicotine dependence: Secondary | ICD-10-CM | POA: Diagnosis not present

## 2022-06-11 DIAGNOSIS — T45516A Underdosing of anticoagulants, initial encounter: Secondary | ICD-10-CM | POA: Diagnosis present

## 2022-06-11 DIAGNOSIS — I482 Chronic atrial fibrillation, unspecified: Secondary | ICD-10-CM | POA: Diagnosis present

## 2022-06-11 DIAGNOSIS — I6389 Other cerebral infarction: Secondary | ICD-10-CM | POA: Diagnosis not present

## 2022-06-11 DIAGNOSIS — E669 Obesity, unspecified: Secondary | ICD-10-CM | POA: Diagnosis present

## 2022-06-11 DIAGNOSIS — I2510 Atherosclerotic heart disease of native coronary artery without angina pectoris: Secondary | ICD-10-CM | POA: Diagnosis present

## 2022-06-11 DIAGNOSIS — T45615A Adverse effect of thrombolytic drugs, initial encounter: Secondary | ICD-10-CM | POA: Diagnosis not present

## 2022-06-11 DIAGNOSIS — Z5901 Sheltered homelessness: Secondary | ICD-10-CM

## 2022-06-11 DIAGNOSIS — I110 Hypertensive heart disease with heart failure: Secondary | ICD-10-CM | POA: Diagnosis present

## 2022-06-11 DIAGNOSIS — G43109 Migraine with aura, not intractable, without status migrainosus: Principal | ICD-10-CM | POA: Diagnosis present

## 2022-06-11 DIAGNOSIS — E785 Hyperlipidemia, unspecified: Secondary | ICD-10-CM | POA: Diagnosis present

## 2022-06-11 DIAGNOSIS — D62 Acute posthemorrhagic anemia: Secondary | ICD-10-CM | POA: Diagnosis present

## 2022-06-11 DIAGNOSIS — Z91018 Allergy to other foods: Secondary | ICD-10-CM

## 2022-06-11 DIAGNOSIS — Z597 Insufficient social insurance and welfare support: Secondary | ICD-10-CM

## 2022-06-11 DIAGNOSIS — D649 Anemia, unspecified: Secondary | ICD-10-CM | POA: Diagnosis present

## 2022-06-11 DIAGNOSIS — H518 Other specified disorders of binocular movement: Secondary | ICD-10-CM | POA: Diagnosis present

## 2022-06-11 DIAGNOSIS — R29810 Facial weakness: Secondary | ICD-10-CM | POA: Diagnosis present

## 2022-06-11 DIAGNOSIS — Z79899 Other long term (current) drug therapy: Secondary | ICD-10-CM

## 2022-06-11 DIAGNOSIS — I639 Cerebral infarction, unspecified: Secondary | ICD-10-CM | POA: Diagnosis present

## 2022-06-11 DIAGNOSIS — Z950 Presence of cardiac pacemaker: Secondary | ICD-10-CM | POA: Diagnosis present

## 2022-06-11 LAB — CBG MONITORING, ED: Glucose-Capillary: 105 mg/dL — ABNORMAL HIGH (ref 70–99)

## 2022-06-11 LAB — I-STAT CHEM 8, ED
BUN: 10 mg/dL (ref 6–20)
Calcium, Ion: 1.07 mmol/L — ABNORMAL LOW (ref 1.15–1.40)
Chloride: 103 mmol/L (ref 98–111)
Creatinine, Ser: 0.7 mg/dL (ref 0.61–1.24)
Glucose, Bld: 97 mg/dL (ref 70–99)
HCT: 27 % — ABNORMAL LOW (ref 39.0–52.0)
Hemoglobin: 9.2 g/dL — ABNORMAL LOW (ref 13.0–17.0)
Potassium: 3.7 mmol/L (ref 3.5–5.1)
Sodium: 138 mmol/L (ref 135–145)
TCO2: 23 mmol/L (ref 22–32)

## 2022-06-11 LAB — COMPREHENSIVE METABOLIC PANEL
ALT: 12 U/L (ref 0–44)
AST: 19 U/L (ref 15–41)
Albumin: 3.2 g/dL — ABNORMAL LOW (ref 3.5–5.0)
Alkaline Phosphatase: 53 U/L (ref 38–126)
Anion gap: 9 (ref 5–15)
BUN: 10 mg/dL (ref 6–20)
CO2: 23 mmol/L (ref 22–32)
Calcium: 8.1 mg/dL — ABNORMAL LOW (ref 8.9–10.3)
Chloride: 106 mmol/L (ref 98–111)
Creatinine, Ser: 0.86 mg/dL (ref 0.61–1.24)
GFR, Estimated: >60 mL/min (ref >60)
Glucose, Bld: 98 mg/dL (ref 70–99)
Potassium: 3.7 mmol/L (ref 3.5–5.1)
Sodium: 138 mmol/L (ref 135–145)
Total Bilirubin: 0.4 mg/dL (ref 0.3–1.2)
Total Protein: 5.9 g/dL — ABNORMAL LOW (ref 6.5–8.1)

## 2022-06-11 LAB — CBC
HCT: 28.6 % — ABNORMAL LOW (ref 39.0–52.0)
Hemoglobin: 8.9 g/dL — ABNORMAL LOW (ref 13.0–17.0)
MCH: 23.9 pg — ABNORMAL LOW (ref 26.0–34.0)
MCHC: 31.1 g/dL (ref 30.0–36.0)
MCV: 76.9 fL — ABNORMAL LOW (ref 80.0–100.0)
Platelets: 221 10*3/uL (ref 150–400)
RBC: 3.72 MIL/uL — ABNORMAL LOW (ref 4.22–5.81)
RDW: 14.9 % (ref 11.5–15.5)
WBC: 3.9 10*3/uL — ABNORMAL LOW (ref 4.0–10.5)
nRBC: 0 % (ref 0.0–0.2)

## 2022-06-11 LAB — DIFFERENTIAL
Abs Immature Granulocytes: 0.01 10*3/uL (ref 0.00–0.07)
Basophils Absolute: 0.1 10*3/uL (ref 0.0–0.1)
Basophils Relative: 1 %
Eosinophils Absolute: 0.1 10*3/uL (ref 0.0–0.5)
Eosinophils Relative: 2 %
Immature Granulocytes: 0 %
Lymphocytes Relative: 34 %
Lymphs Abs: 1.3 10*3/uL (ref 0.7–4.0)
Monocytes Absolute: 0.4 10*3/uL (ref 0.1–1.0)
Monocytes Relative: 10 %
Neutro Abs: 2.1 10*3/uL (ref 1.7–7.7)
Neutrophils Relative %: 53 %

## 2022-06-11 LAB — ECHOCARDIOGRAM COMPLETE
Area-P 1/2: 3.66 cm2
Calc EF: 64.4 %
Height: 71 in
S' Lateral: 3.2 cm
Single Plane A2C EF: 67.2 %
Single Plane A4C EF: 63.1 %
Weight: 2857.16 oz

## 2022-06-11 LAB — ETHANOL: Alcohol, Ethyl (B): <10 mg/dL (ref <10)

## 2022-06-11 LAB — HEMOGLOBIN A1C
Hgb A1c MFr Bld: 5.2 % (ref 4.8–5.6)
Mean Plasma Glucose: 102.54 mg/dL

## 2022-06-11 LAB — PROTIME-INR
INR: 1.5 — ABNORMAL HIGH (ref 0.8–1.2)
Prothrombin Time: 18 seconds — ABNORMAL HIGH (ref 11.4–15.2)

## 2022-06-11 LAB — MRSA NEXT GEN BY PCR, NASAL: MRSA by PCR Next Gen: NOT DETECTED

## 2022-06-11 LAB — APTT: aPTT: 32 seconds (ref 24–36)

## 2022-06-11 LAB — GLUCOSE, CAPILLARY: Glucose-Capillary: 113 mg/dL — ABNORMAL HIGH (ref 70–99)

## 2022-06-11 LAB — HIV ANTIBODY (ROUTINE TESTING W REFLEX): HIV Screen 4th Generation wRfx: NONREACTIVE

## 2022-06-11 MED ORDER — TRAZODONE HCL 50 MG PO TABS
50.0000 mg | ORAL_TABLET | Freq: Every evening | ORAL | Status: DC | PRN
  Administered 2022-06-14: 50 mg via ORAL
  Filled 2022-06-11: qty 1

## 2022-06-11 MED ORDER — VITAMIN D 25 MCG (1000 UNIT) PO TABS
5000.0000 [IU] | ORAL_TABLET | Freq: Every day | ORAL | Status: DC
  Administered 2022-06-12 – 2022-06-15 (×4): 5000 [IU] via ORAL
  Filled 2022-06-11 (×5): qty 5

## 2022-06-11 MED ORDER — IOHEXOL 350 MG/ML SOLN
100.0000 mL | Freq: Once | INTRAVENOUS | Status: AC | PRN
  Administered 2022-06-11: 100 mL via INTRAVENOUS

## 2022-06-11 MED ORDER — PANTOPRAZOLE SODIUM 40 MG IV SOLR: 40.0000 mg | Freq: Every day | INTRAVENOUS | Status: DC

## 2022-06-11 MED ORDER — ACETAMINOPHEN 160 MG/5ML PO SOLN: 650.0000 mg | ORAL | Status: DC | PRN

## 2022-06-11 MED ORDER — VITAMIN B-12 1000 MCG PO TABS
1000.0000 ug | ORAL_TABLET | Freq: Every day | ORAL | Status: DC
  Administered 2022-06-11 – 2022-06-15 (×5): 1000 ug via ORAL
  Filled 2022-06-11 (×5): qty 1

## 2022-06-11 MED ORDER — OXYCODONE-ACETAMINOPHEN 10-325 MG PO TABS: 1.0000 | ORAL_TABLET | Freq: Four times a day (QID) | ORAL | Status: DC | PRN

## 2022-06-11 MED ORDER — ATORVASTATIN CALCIUM 40 MG PO TABS
40.0000 mg | ORAL_TABLET | Freq: Every day | ORAL | Status: DC
  Administered 2022-06-12 – 2022-06-14 (×3): 40 mg via ORAL
  Filled 2022-06-11 (×3): qty 1

## 2022-06-11 MED ORDER — GABAPENTIN 300 MG PO CAPS
300.0000 mg | ORAL_CAPSULE | Freq: Three times a day (TID) | ORAL | Status: DC
  Administered 2022-06-11 – 2022-06-15 (×11): 300 mg via ORAL
  Filled 2022-06-11 (×11): qty 1

## 2022-06-11 MED ORDER — ACETAMINOPHEN 325 MG PO TABS
650.0000 mg | ORAL_TABLET | ORAL | Status: DC | PRN
  Administered 2022-06-12 – 2022-06-13 (×2): 650 mg via ORAL
  Filled 2022-06-11 (×3): qty 2

## 2022-06-11 MED ORDER — LABETALOL HCL 5 MG/ML IV SOLN: 10.0000 mg | Freq: Once | INTRAVENOUS | Status: DC | PRN

## 2022-06-11 MED ORDER — CHLORHEXIDINE GLUCONATE CLOTH 2 % EX PADS
6.0000 | MEDICATED_PAD | Freq: Every day | CUTANEOUS | Status: DC
  Administered 2022-06-11 – 2022-06-15 (×4): 6 via TOPICAL

## 2022-06-11 MED ORDER — OXYCODONE-ACETAMINOPHEN 5-325 MG PO TABS
1.0000 | ORAL_TABLET | Freq: Four times a day (QID) | ORAL | Status: DC | PRN
  Administered 2022-06-11 – 2022-06-15 (×14): 1 via ORAL
  Filled 2022-06-11 (×14): qty 1

## 2022-06-11 MED ORDER — OXYCODONE HCL 5 MG PO TABS
5.0000 mg | ORAL_TABLET | Freq: Four times a day (QID) | ORAL | Status: DC | PRN
  Administered 2022-06-11 – 2022-06-15 (×13): 5 mg via ORAL
  Filled 2022-06-11 (×13): qty 1

## 2022-06-11 MED ORDER — SODIUM CHLORIDE 0.9 % IV SOLN: INTRAVENOUS | Status: DC

## 2022-06-11 MED ORDER — ONDANSETRON HCL 4 MG/2ML IJ SOLN
4.0000 mg | Freq: Four times a day (QID) | INTRAMUSCULAR | Status: DC | PRN
  Administered 2022-06-11 – 2022-06-15 (×5): 4 mg via INTRAVENOUS
  Filled 2022-06-11 (×5): qty 2

## 2022-06-11 MED ORDER — TENECTEPLASE FOR STROKE
0.2500 mg/kg | PACK | Freq: Once | INTRAVENOUS | Status: AC
  Administered 2022-06-11: 20 mg via INTRAVENOUS
  Filled 2022-06-11: qty 10

## 2022-06-11 MED ORDER — ACETAMINOPHEN 650 MG RE SUPP: 650.0000 mg | RECTAL | Status: DC | PRN

## 2022-06-11 MED ORDER — STROKE: EARLY STAGES OF RECOVERY BOOK: Freq: Once | Status: AC

## 2022-06-11 MED ORDER — LEVETIRACETAM 750 MG PO TABS
750.0000 mg | ORAL_TABLET | Freq: Two times a day (BID) | ORAL | Status: DC
  Administered 2022-06-11 – 2022-06-15 (×8): 750 mg via ORAL
  Filled 2022-06-11 (×8): qty 1

## 2022-06-11 MED ORDER — PANTOPRAZOLE SODIUM 40 MG PO TBEC
40.0000 mg | DELAYED_RELEASE_TABLET | Freq: Every day | ORAL | Status: DC
  Administered 2022-06-11 – 2022-06-14 (×4): 40 mg via ORAL
  Filled 2022-06-11 (×4): qty 1

## 2022-06-11 MED ORDER — NICARDIPINE HCL IN NACL 20-0.86 MG/200ML-% IV SOLN: 0.0000 mg/h | INTRAVENOUS | Status: DC | PRN

## 2022-06-11 MED ORDER — BUSPIRONE HCL 10 MG PO TABS
10.0000 mg | ORAL_TABLET | Freq: Two times a day (BID) | ORAL | Status: DC
  Administered 2022-06-11 – 2022-06-15 (×8): 10 mg via ORAL
  Filled 2022-06-11 (×8): qty 1

## 2022-06-11 NOTE — ED Notes (Signed)
Pt transported to 4N21

## 2022-06-11 NOTE — Progress Notes (Addendum)
PHARMACIST CODE STROKE RESPONSE  Notified to mix TNK at 11:06 by Dr. Cheral Marker: TNK preparation completed at 11:09  TNK dose = 20 mg IV over 5 seconds.   Issues/delays encountered (if applicable): N/A  Note: pt reports running out of his home supply of Xarelto and has not been taking for ~1 week.   Kaleen Mask 06/11/22 10:59 AM
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
## 2022-06-11 NOTE — Consult Note (Addendum)
Neurology Admission History and Physical  Reason for Consult: Code Stroke  Referring Physician: Dr. Rubin Payor  CC: Left sided weakness  History is obtained from:Patient, chart review  HPI: Richard Benson is a 46 y.o. 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  At approximately 0845 this morning he developed a severe headache, right facial droop, and left sided weakness. He had previously been in his usual state of health and been able to go to the grocery store. He states that he has been on Xarelto since 2014, but has not taken it in a week due to running out. He has been compliant with his seizure medications. He reports a 9/10 right temporal headache reporting nausea, photophobia, sonophobia, and osmophobia. Additionally he does have a history of seizures and did demonstrate upper extremity shaking with head shaking during the EMS trip lasting approximately 10 seconds.    LKW: 0845 IV thrombolysis given?: Yes IR: No, no LVO Premorbid modified Rankin scale (mRS):  2-Slight disability-UNABLE to perform all activities but does not need assistance   Management with thrombolytic therapy was explained to the patient, as were risks, benefits and alternatives. All questions were answered. Patient expressed understanding of the treatment plan and gave informed consent to proceed with thrombolytic treatment.   ROS: Full ROS was performed and is negative except as noted in the HPI.    Past Medical History:  Diagnosis Date   Blind left eye    CHF (congestive heart failure) (HCC)    Obesity    Stroke [facility])   Atrial fibrillation Seizures  No family history on file.  Social History:   reports that he has quit smoking. His smoking use included cigarettes. He has never used smokeless  tobacco. He reports that he does not drink alcohol and does not use drugs.  Medications No current facility-administered medications for this encounter.  Current Outpatient Medications:    acetaminophen (TYLENOL) 500 MG tablet, Take 500-1,000 mg by mouth every 6 (six) hours as needed (for pain)., Disp: , Rfl:    aspirin EC 81 MG tablet, Take 1 tablet (81 mg total) by mouth daily., Disp: 30 tablet, Rfl: 6   atorvastatin (LIPITOR) 40 MG tablet, Take 1 tablet (40 mg total) by mouth daily at 6 PM., Disp: 30 tablet, Rfl: 6   baclofen (LIORESAL) 10 MG tablet, Take 10 mg by mouth at bedtime as needed for muscle spasms., Disp: , Rfl:    busPIRone (BUSPAR) 10 MG tablet, Take 10 mg by mouth 2 (two) times daily., Disp: , Rfl:    Cholecalciferol (VITAMIN D3) 5000 units CAPS, Take 5,000 Units by mouth daily., Disp: , Rfl:    Cyanocobalamin (VITAMIN B-12 PO), Take 1 tablet by mouth daily., Disp: , Rfl:    diclofenac sodium (VOLTAREN) 1 % GEL, Apply 2 g topically 4 (four) times daily as needed (pain)., Disp: , Rfl:    gabapentin (NEURONTIN) 300 MG capsule, Take 300 mg by mouth 3 (three) times daily., Disp: , Rfl:    levETIRAcetam (KEPPRA) 750 MG tablet, Take 1 tablet (750 mg total) by mouth 2 (two) times daily., Disp: 60 tablet, Rfl: 6   Multiple Vitamin (MULTIVITAMIN WITH MINERALS) TABS tablet, Take 1 tablet by mouth daily., Disp: , Rfl:    nitroGLYCERIN (NITROSTAT) 0.4 MG SL tablet, Place  0.4 mg under the tongue every 5 (five) minutes as needed for chest pain. , Disp: , Rfl:    ondansetron (ZOFRAN-ODT) 4 MG disintegrating tablet, Take 4 mg by mouth 3 (three) times daily as needed for nausea or vomiting., Disp: , Rfl:    oxyCODONE-acetaminophen (PERCOCET) 10-325 MG tablet, Take 1 tablet by mouth every 6 (six) hours as needed for pain. , Disp: , Rfl:    pantoprazole (PROTONIX) 40 MG tablet, Take 40 mg by mouth 2 (two) times daily., Disp: , Rfl:    promethazine (PHENERGAN) 25 MG tablet, Take 25 mg by mouth  every 6 (six) hours as needed for nausea or vomiting., Disp: , Rfl:    rivaroxaban (XARELTO) 20 MG TABS tablet, Take 1 tablet (20 mg total) by mouth daily with supper., Disp: 30 tablet, Rfl:    torsemide (DEMADEX) 20 MG tablet, Take 20 mg by mouth every other day as needed (swelling)., Disp: , Rfl:    traZODone (DESYREL) 50 MG tablet, Take 50-100 mg by mouth at bedtime as needed for sleep., Disp: , Rfl:   Exam: Current vital signs: There were no vitals taken for this visit. Vital signs in last 24 hours:    GENERAL: Awake, alert in NAD HEENT: - Normocephalic and atraumatic, dry mm, no LN++, no Thyromegally LUNGS - Clear to auscultation bilaterally with no wheezes CV - S1S2 RRR, no m/r/g, equal pulses bilaterally. ABDOMEN - Soft, nontender, nondistended with normoactive BS Ext: warm, well perfused, intact peripheral pulses, no edema  NEURO:  Mental Status: AA&Ox3.  Naming, repetition, fluency, and comprehension intact. Cranial Nerves: PERRL, dysconjugate exotropic gaze at baseline with left monocular blindness. Mild right facial droop, facial sensation intact, hearing intact, tongue/uvula/soft palate midline, normal sternocleidomastoid and trapezius muscle strength. No evidence of tongue atrophy Motor:  RUE 5/5  LUE 1/5 RLE 5/5  LLE 1/5 Tone: is normal and bulk is normal Sensation- Intact to light touch bilaterally Coordination: FTN intact on the right  Gait- deferred  NIHSS 1a Level of Conscious.: 0 1b LOC Questions: 0 1c LOC Commands: 0 2 Best Gaze: 0 3 Visual: 2 4 Facial Palsy: 1 (Right) 5a Motor Arm - left: 3 5b Motor Arm - Right: 0 6a Motor Leg - Left: 3 6b Motor Leg - Right: 0 7 Limb Ataxia: 0 8 Sensory: 0 9 Best Language: 0 10 Dysarthria: 0 11 Extinct. and Inatten.: 0 TOTAL: 9   Labs I have reviewed labs in epic and the results pertinent to this consultation are:  CBC    Component Value Date/Time   WBC 5.6 04/23/2021 1320   RBC 4.71 04/23/2021 1320   HGB  12.9 (L) 04/23/2021 1327   HCT 38.0 (L) 04/23/2021 1327   PLT 202 04/23/2021 1320   MCV 86.6 04/23/2021 1320   MCH 28.9 04/23/2021 1320   MCHC 33.3 04/23/2021 1320   RDW 14.3 04/23/2021 1320   LYMPHSABS 2.1 04/23/2021 1320   MONOABS 0.5 04/23/2021 1320   EOSABS 0.1 04/23/2021 1320   BASOSABS 0.1 04/23/2021 1320    CMP     Component Value Date/Time   NA 137 04/23/2021 1327   K 3.5 04/23/2021 1327   CL 101 04/23/2021 1327   CO2 29 04/23/2021 1320   GLUCOSE 77 04/23/2021 1327   BUN 9 04/23/2021 1327   CREATININE 0.70 04/23/2021 1327   CALCIUM 8.2 (L) 04/23/2021 1320   PROT 5.5 (L) 04/23/2021 1320   ALBUMIN 3.0 (L) 04/23/2021 1320   AST 31 04/23/2021 1320  ALT 20 04/23/2021 1320   ALKPHOS 41 04/23/2021 1320   BILITOT 0.6 04/23/2021 1320   GFRNONAA >60 04/23/2021 1320   GFRAA >60 02/14/2018 0321    Lipid Panel     Component Value Date/Time   CHOL 131 11/05/2017 0629   TRIG 76 11/05/2017 0629   HDL 40 (L) 11/05/2017 0629   CHOLHDL 3.3 11/05/2017 0629   VLDL 15 11/05/2017 0629   LDLCALC 76 11/05/2017 0629     Imaging I have reviewed the images obtained:  CT-head CTA Head and Neck with perfusion 1. No acute intracranial pathology. 2. Normal vasculature of the head and neck with no hemodynamically significant stenosis, occlusion, or dissection. 3. No infarct core or penumbra on CT perfusion.   MRI examination of the brain- pending   Assessment: 46 y.o. 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  At approximately 0845 this morning he developed a severe headache, right facial droop, and left sided weakness. He reports a 9/10 right temporal headache with nausea, photophobia, sonophobia, and osmophobia. Additionally he does have a history of seizures and did demonstrate upper extremity shaking with head shaking during EMS  trip, lasting approximately 10 seconds.   - Exam reveals left hemiplegia. NIHSS 9.  - CT head: No acute intracranial pathology.  - CTA of head and neck with CTP: Normal vasculature of the head and neck with no hemodynamically significant stenosis, occlusion, or dissection. No infarct core or penumbra on CT perfusion. - Although complicated migraine is also on the DDx, the overall benefits of TNK administration, given the relatively high likelihood of stroke, are felt to outweigh the risks.  - After comprehensive review of possible contraindications, he has no absolute contraindications to TNK administration. Regarding his Xarelto prescription, he confirms on requestioning that he has not taken any of his anticoagulant pills in a week due to having run out of this medication.  - Patient is a TNK candidate. Discussed extensively the risks/benefits of TNK treatment vs. no treatment with the patient, including risks of hemorrhage and death with TNK administration versus worse overall outcomes on average in patients within the IV thrombolytic time window who are not administered TNK. Overall benefits of TNK regarding long-term prognosis are felt to outweigh risks. The patient expressed understanding and wish to proceed with TNK.   Plan: 1. Admitting to Neuro ICU.  2. Post-TNK order set to include frequent neuro checks and BP management.  3. No antiplatelet medications or anticoagulants for at least 24 hours following TNK.  4. DVT prophylaxis with SCDs.  5. Continue atorvastatin.  6. Will need to restart Xarelto if follow up CT at 24 hours is negative for hemorrhagic conversion. No strong indication for continuing ASA as an adjunct to his anticoagulant as he has no history of stenting listed in Epic. Stroke Team to confirm by reinterviewing patient in the AM.  7. TTE.  8. MRI brain.  9. Cardiac telemetry 10. PT/OT/Speech.  11. NPO until passes swallow evaluation.  12. Fasting lipid panel, HgbA1c 13.  Continue seizure medications, will also order an EEG.  14. Pain medication PRN for headache  CNS -Close neuro monitoring - Seizure precautions  -NPO until swallow screen is done  -Advance diet as tolerated -PT/OT  RESP -monitor   CV -Aggressive BP control, goal SBP < 180 - Cleviprex ordered Heart failure, unspecified -TTE  Hyperlipidemia, unspecified  - Statin for goal LDL < 70 Chronic atrial fibrillation -Rate control - Prescribed xarelto- noncompliant x1week   HEME -Monitor -Transfuse for hgb < 7  Coagulopathy secondary to TNK administration   ENDO -goal HgbA1c < 7%  GI/GU -Gentle hydration  Fluid/Electrolyte Disorders -Replete -Repeat labs -Trend  ID -CXR -NPO -Monitor  Nutrition E66.9 Obesity   Prophylaxis DVT:  SCDs GI: PPI Bowel: Docusate / Senna  Diet: NPO until cleared by speech  Code Status: Full Code     -- Patient seen and examined by NP/APP with MD.  Elmer Picker, DNP, FNP-BC [facility] Pager: [phone]  I have seen and examined the patient. I have formulated the assessment and plan. 46 year old male presenting with left hemiplegia, headache and seizure like activity. NIHSS 9. Patient determined to be a TNK candidate which was administered in the ED. Admitting to 4N ICU with plan as documented above. Electronically signed: Dr. Caryl Pina

## 2022-06-11 NOTE — ED Notes (Signed)
Pt reporting R facial numbness and decreased sensation to L leg

## 2022-06-11 NOTE — ED Triage Notes (Signed)
Pt arrived to ED via [facility] from [facility] as a CODE STROKE LKW 0830 w/ s/s onset 0845. S/S reported: L weakness, decreased sensation and tingling to L side, R facial droop. Pt also c/o headache and CP. ASA was not given d/t code stroke being activated. Pt hx of Sz, CVA, HF, Migraines, A-fib. Pt normally takes xarelto, but hasn't taken it in about a week. VSS w/ EMS. CBG 92. Bilateral 18g Acs. Pt is homeless. Pt has L eye blindness and deviation from previous eye injury.

## 2022-06-11 NOTE — Progress Notes (Signed)
  Echocardiogram 2D Echocardiogram has been performed.  Richard Benson 06/11/2022, 2:23 PM

## 2022-06-11 NOTE — Code Documentation (Signed)
Stroke Response Nurse Documentation Code Documentation  Richard Benson is a 46 y.o. male arriving to [facility]  via [facility] on 06/11/22 with past medical hx of CVA, Afib (on Xarelto), CHF, HLD, Migraines, . On Xarelto (rivaroxaban) daily but has not taken it in over a week since he ran out. Code stroke was activated by EMS.   Patient from a daily homeless care facility where he was LKW at [facility] and now complaining of left sided weakness/tingling, right facial droop. Pt has had an old stroke with left sided deficits but they had resolved. New left sided weakness today where pt was not able to walk. Staff at the care facility called EMS. BP and CBG WNL with EMS. Pt in Afib on the monitor. Pt also complaining of 9/10 headache, chest pain and vision blurriness off and on. Pt has a left eye deviation and blindness that is baseline since 1990 per pt.   Stroke team at the bedside on patient arrival. Labs drawn and patient cleared for CT by Dr. Alvino Chapel. Patient to CT with team. NIHSS 9, see documentation for details and code stroke times. Patient with left hemianopia, right facial droop, left arm weakness, and left leg weakness on exam. The following imaging was completed:  CT Head, CTA, and CTP. Patient is a candidate for IV Thrombolytic due to within the window and had not taken Xarelto in over a week. He was explained the risks and benefits and agreed to TNK. Patient is not a candidate for IR due to no LVO per MD. TNK given at 1110. BP 122/60.   Care Plan: Q15x2hrs, full NIHSS, Q30x6hrs, Q1x16hrs. BP<180/105. [facility] Swallow before given anything PO. Admission to Neuro ICU if bed available. 4N charge aware of need.    Bedside handoff with ED RN Raquel Sarna and Justice Rocher.    Margarette Asal  Stroke Response RN [phone]

## 2022-06-11 NOTE — ED Provider Notes (Signed)
[facility] EMERGENCY DEPARTMENT Provider Note   CSN: [id number] Arrival date & time: 06/11/22  1044  An emergency department physician performed an initial assessment on this suspected stroke patient at 1044.  History  Chief Complaint  Patient presents with   Code Stroke    Richard Benson is a 46 y.o. male.  HPI Patient presents as a code stroke.  Last normal reportedly at 845.  Reportedly developed weakness on his left side.  States he had been at the store.  Has had a previous stroke.  Is on Xarelto for A-fib but has not taken it in a week.  CBG and pressure reassuring for EMS.  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  Patient states he has no weakness at baseline however.  On arrival but mom myself and Dr. Otelia Limes from the stroke team.   Past Medical History:  Diagnosis Date   Blind left eye    CHF (congestive heart failure) (HCC)    Obesity    Stroke [facility])     Home Medications Prior to Admission medications   Medication Sig Start Date End Date Taking? Authorizing Provider  acetaminophen (TYLENOL) 500 MG tablet Take 500-1,000 mg by mouth every 6 (six) hours as needed (for pain).    [provider]  aspirin EC 81 MG tablet Take 1 tablet (81 mg total) by mouth daily. 11/06/17   Danford, Earl Lites, MD  atorvastatin (LIPITOR) 40 MG tablet Take 1 tablet (40 mg total) by mouth daily at 6 PM. 11/06/17   Danford, Earl Lites, MD  baclofen (LIORESAL) 10 MG tablet Take 10 mg by mouth at bedtime as needed for muscle spasms.    [provider]  busPIRone (BUSPAR) 10 MG tablet Take 10 mg by mouth 2 (two) times daily.    [provider]  Cholecalciferol (VITAMIN D3) 5000 units CAPS Take 5,000 Units by mouth daily.    [provider]  Cyanocobalamin (VITAMIN B-12 PO) Take 1 tablet by mouth daily.    [provider]  diclofenac sodium  (VOLTAREN) 1 % GEL Apply 2 g topically 4 (four) times daily as needed (pain).    [provider]  gabapentin (NEURONTIN) 300 MG capsule Take 300 mg by mouth 3 (three) times daily. 03/31/21   [provider]  levETIRAcetam (KEPPRA) 750 MG tablet Take 1 tablet (750 mg total) by mouth 2 (two) times daily. 11/06/17   Danford, Earl Lites, MD  Multiple Vitamin (MULTIVITAMIN WITH MINERALS) TABS tablet Take 1 tablet by mouth daily.    [provider]  nitroGLYCERIN (NITROSTAT) 0.4 MG SL tablet Place 0.4 mg under the tongue every 5 (five) minutes as needed for chest pain.     [provider]  ondansetron (ZOFRAN-ODT) 4 MG disintegrating tablet Take 4 mg by mouth 3 (three) times daily as needed for nausea or vomiting. 03/26/21   [provider]  oxyCODONE-acetaminophen (PERCOCET) 10-325 MG tablet Take 1 tablet by mouth every 6 (six) hours as needed for pain.     [provider]  pantoprazole (PROTONIX) 40 MG tablet Take 40 mg by mouth 2 (two) times daily.    [provider]  promethazine (PHENERGAN) 25 MG tablet Take 25 mg by mouth every 6 (six) hours as needed for nausea or vomiting.    [provider]  rivaroxaban (XARELTO) 20 MG TABS tablet Take 1 tablet (20 mg total) by  mouth daily with supper. 09/07/17   Meuth, Brooke A, PA-C  torsemide (DEMADEX) 20 MG tablet Take 20 mg by mouth every other day as needed (swelling).    [provider]  traZODone (DESYREL) 50 MG tablet Take 50-100 mg by mouth at bedtime as needed for sleep. 11/04/20   [provider]      Allergies    Flax seeds [flaxseed (linseed)] and Soy allergy    Review of Systems   Review of Systems  Physical Exam Updated Vital Signs BP 103/76   Pulse 61   Temp 98 F (36.7 C) (Oral)   Resp 10   Ht 5\' 11"  (1.803 m)   Wt 81 kg   SpO2 99%   BMI 24.91 kg/m  Physical Exam Vitals and nursing note reviewed.  HENT:     Head: Normocephalic.   Cardiovascular:     Rate and Rhythm: Normal rate.  Pulmonary:     Breath sounds: No wheezing or rhonchi.  Abdominal:     Tenderness: There is no abdominal tenderness.  Musculoskeletal:     Cervical back: Neck supple.  Neurological:     Mental Status: He is alert.     Comments: Left eye deviated laterally.  Cannot move left upper or lower extremity.  Complete NIH scoring done by neurology.     ED Results / Procedures / Treatments   Labs (all labs ordered are listed, but only abnormal results are displayed) Labs Reviewed  PROTIME-INR - Abnormal; Notable for the following components:      Result Value   Prothrombin Time 18.0 (*)    INR 1.5 (*)    All other components within normal limits  CBC - Abnormal; Notable for the following components:   WBC 3.9 (*)    RBC 3.72 (*)    Hemoglobin 8.9 (*)    HCT 28.6 (*)    MCV 76.9 (*)    MCH 23.9 (*)    All other components within normal limits  COMPREHENSIVE METABOLIC PANEL - Abnormal; Notable for the following components:   Calcium 8.1 (*)    Total Protein 5.9 (*)    Albumin 3.2 (*)    All other components within normal limits  I-STAT CHEM 8, ED - Abnormal; Notable for the following components:   Calcium, Ion 1.07 (*)    Hemoglobin 9.2 (*)    HCT 27.0 (*)    All other components within normal limits  CBG MONITORING, ED - Abnormal; Notable for the following components:   Glucose-Capillary 105 (*)    All other components within normal limits  MRSA NEXT GEN BY PCR, NASAL  APTT  DIFFERENTIAL  ETHANOL  HIV ANTIBODY (ROUTINE TESTING W REFLEX)  HEMOGLOBIN A1C    EKG None  Radiology ECHOCARDIOGRAM COMPLETE  Result Date: 06/11/2022    ECHOCARDIOGRAM REPORT   Patient Name:   Richard Benson Date of Exam: 06/11/2022 Medical Rec #:  [id number]           Height:       71.0 in Accession #:    [id number]          Weight:       178.6 lb Date of Birth:  1976-02-19           BSA:          2.009 m Patient Age:    71 years             BP:  103/76 mmHg Patient Gender: M                   HR:           61 bpm. Exam Location:  Inpatient Procedure: 2D Echo, Cardiac Doppler and Color Doppler Indications:    Stroke  History:        Patient has prior history of Echocardiogram examinations, most                 recent 12/15/2008. Abnormal ECG and Pacemaker, Stroke,                 Signs/Symptoms:Syncope; Risk Factors:Dyslipidemia.  Sonographer:    Sheralyn Boatman RDCS Referring Phys: [id number] [facility]  Sonographer Comments: Technically difficult study due to poor echo windows. IMPRESSIONS  1. Left ventricular ejection fraction, by estimation, is 60 to 65%. The left ventricle has normal function. The left ventricle has no regional wall motion abnormalities. Left ventricular diastolic parameters were normal.  2. Right ventricular systolic function is normal. The right ventricular size is normal. There is normal pulmonary artery systolic pressure.  3. The mitral valve is normal in structure. Trivial mitral valve regurgitation. No evidence of mitral stenosis.  4. The aortic valve is tricuspid. Aortic valve regurgitation is not visualized. No aortic stenosis is present.  5. The inferior vena cava is dilated in size with <50% respiratory variability, suggesting right atrial pressure of 15 mmHg. Comparison(s): No prior Echocardiogram. FINDINGS  Left Ventricle: Left ventricular ejection fraction, by estimation, is 60 to 65%. The left ventricle has normal function. The left ventricle has no regional wall motion abnormalities. The left ventricular internal cavity size was normal in size. There is  no left ventricular hypertrophy. Left ventricular diastolic parameters were normal. Right Ventricle: The right ventricular size is normal. Right ventricular systolic function is normal. There is normal pulmonary artery systolic pressure. The tricuspid regurgitant velocity is 2.06 m/s, and with an assumed right atrial pressure of 15 mmHg, the estimated right  ventricular systolic pressure is 32.0 mmHg. Left Atrium: Left atrial size was normal in size. Right Atrium: Right atrial size was normal in size. Pericardium: There is no evidence of pericardial effusion. Mitral Valve: The mitral valve is normal in structure. Trivial mitral valve regurgitation. No evidence of mitral valve stenosis. Tricuspid Valve: The tricuspid valve is normal in structure. Tricuspid valve regurgitation is mild . No evidence of tricuspid stenosis. Aortic Valve: The aortic valve is tricuspid. Aortic valve regurgitation is not visualized. No aortic stenosis is present. Pulmonic Valve: The pulmonic valve was not well visualized. Pulmonic valve regurgitation is not visualized. No evidence of pulmonic stenosis. Aorta: The aortic root is normal in size and structure. Venous: The inferior vena cava is dilated in size with less than 50% respiratory variability, suggesting right atrial pressure of 15 mmHg. IAS/Shunts: No atrial level shunt detected by color flow Doppler. Additional Comments: A device lead is visualized.  LEFT VENTRICLE PLAX 2D LVIDd:         4.60 cm     Diastology LVIDs:         3.20 cm     LV e' medial:    17.40 cm/s LV PW:         0.80 cm     LV E/e' medial:  5.4 LV IVS:        0.90 cm     LV e' lateral:   18.20 cm/s LVOT diam:     2.60 cm  LV E/e' lateral: 5.2 LV SV:         108 LV SV Index:   54 LVOT Area:     5.31 cm  LV Volumes (MOD) LV vol d, MOD A2C: 77.7 ml LV vol d, MOD A4C: 84.2 ml LV vol s, MOD A2C: 25.5 ml LV vol s, MOD A4C: 31.1 ml LV SV MOD A2C:     52.2 ml LV SV MOD A4C:     84.2 ml LV SV MOD BP:      53.2 ml RIGHT VENTRICLE             IVC RV S prime:     14.00 cm/s  IVC diam: 2.60 cm TAPSE (M-mode): 3.0 cm LEFT ATRIUM             Index        RIGHT ATRIUM           Index LA diam:        2.60 cm 1.29 cm/m   RA Area:     15.10 cm LA Vol (A2C):   45.4 ml 22.59 ml/m  RA Volume:   43.60 ml  21.70 ml/m LA Vol (A4C):   32.4 ml 16.12 ml/m LA Biplane Vol: 39.4 ml 19.61  ml/m  AORTIC VALVE LVOT Vmax:   106.00 cm/s LVOT Vmean:  68.400 cm/s LVOT VTI:    0.204 m  AORTA Ao Root diam: 3.30 cm Ao Asc diam:  3.70 cm MITRAL VALVE               TRICUSPID VALVE MV Area (PHT): 3.66 cm    TR Peak grad:   17.0 mmHg MV Decel Time: 207 msec    TR Vmax:        206.00 cm/s MV E velocity: 94.12 cm/s MV A velocity: 82.02 cm/s  SHUNTS MV E/A ratio:  1.15        Systemic VTI:  0.20 m                            Systemic Diam: 2.60 cm Olga MillersBrian Crenshaw MD Electronically signed by Olga MillersBrian Crenshaw MD Signature Date/Time: 06/11/2022/2:44:34 PM    Final    CT HEAD CODE STROKE WO CONTRAST  Result Date: 06/11/2022 CLINICAL DATA:  Left-sided weakness, headache. EXAM: CT ANGIOGRAPHY HEAD AND NECK CT PERFUSION BRAIN TECHNIQUE: Multidetector CT imaging of the head and neck was performed using the standard protocol during bolus administration of intravenous contrast. Multiplanar CT image reconstructions and MIPs were obtained to evaluate the vascular anatomy. Carotid stenosis measurements (when applicable) are obtained utilizing NASCET criteria, using the distal internal carotid diameter as the denominator. Multiphase CT imaging of the brain was performed following IV bolus contrast injection. Subsequent parametric perfusion maps were calculated using RAPID software. RADIATION DOSE REDUCTION: This exam was performed according to the departmental dose-optimization program which includes automated exposure control, adjustment of the mA and/or kV according to patient size and/or use of iterative reconstruction technique. CONTRAST:  60 Omni 350 COMPARISON:  CT/CTA head and neck 04/23/2021 FINDINGS: CT HEAD FINDINGS Brain: There is no acute intracranial hemorrhage, extra-axial fluid collection, or acute infarct. Parenchymal volume is normal. The ventricles are normal in size. Gray-white differentiation is preserved There is no mass lesion. There is no mass effect or midline shift. Vascular: See below. Skull: Normal.  Negative for fracture or focal lesion. Sinuses/Orbits: The paranasal sinuses are clear. Left lens implant and scleral buckle are  noted. The globes and orbits are otherwise unremarkable. Other: None. ASPECTS (Alberta Stroke Program Early CT Score) - Ganglionic level infarction (caudate, lentiform nuclei, internal capsule, insula, M1-M3 cortex): 7 - Supraganglionic infarction (M4-M6 cortex): 3 Total score (0-10 with 10 being normal): 10 Review of the MIP images confirms the above findings CTA NECK FINDINGS Aortic arch: The imaged aortic arch is normal. The origins of the major branch vessels are patent. The subclavian arteries are patent to the level imaged. Right carotid system: The right common, internal, and external carotid arteries are patent, without hemodynamically significant stenosis or occlusion. There is no dissection or aneurysm. Left carotid system: The left common, internal, and external carotid arteries are patent, without hemodynamically significant stenosis or occlusion. There is no dissection or aneurysm. Vertebral arteries: The vertebral arteries are patent, without hemodynamically significant stenosis or occlusion. There is no dissection or aneurysm. Skeleton: There is no acute osseous abnormality or suspicious osseous lesion. 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 There is no visible canal hematoma. Other neck: The soft tissues of the neck are unremarkable. Upper chest: The imaged lung apices are clear. A left chest wall cardiac device and associated leads are partially imaged. Review of the MIP images confirms the above findings CTA HEAD FINDINGS Anterior circulation: The intracranial ICAs are patent. The bilateral MCAs are patent. The bilateral ACAs are patent. The anterior communicating artery is normal. There is no aneurysm or AVM. Posterior circulation: The bilateral V4 segments are patent. The basilar artery is patent. The major cerebellar arteries are patent. The  bilateral PCAs are patent. There is a fetal origin of the right PCA. A left posterior communicating artery is not identified. There is no aneurysm or AVM. Venous sinuses: Patent. Anatomic variants: As above. Review of the MIP images confirms the above findings CT Brain Perfusion Findings: ASPECTS: 10 CBF (<30%) Volume: 84mL Perfusion (Tmax>6.0s) volume: 35mL Mismatch Volume: 52mL Infarction Location:N/a IMPRESSION: 1. No acute intracranial pathology. 2. Normal vasculature of the head and neck with no hemodynamically significant stenosis, occlusion, or dissection. 3. No infarct core or penumbra on CT perfusion. These results were called by telephone at the time of interpretation on 06/11/2022 at 10:58 am to provider Dr Otelia Limes, who verbally acknowledged these results. Electronically Signed   By: Lesia Hausen M.D.   On: 06/11/2022 11:19   CT ANGIO HEAD NECK W WO CM W PERF (CODE STROKE)  Result Date: 06/11/2022 CLINICAL DATA:  Left-sided weakness, headache. EXAM: CT ANGIOGRAPHY HEAD AND NECK CT PERFUSION BRAIN TECHNIQUE: Multidetector CT imaging of the head and neck was performed using the standard protocol during bolus administration of intravenous contrast. Multiplanar CT image reconstructions and MIPs were obtained to evaluate the vascular anatomy. Carotid stenosis measurements (when applicable) are obtained utilizing NASCET criteria, using the distal internal carotid diameter as the denominator. Multiphase CT imaging of the brain was performed following IV bolus contrast injection. Subsequent parametric perfusion maps were calculated using RAPID software. RADIATION DOSE REDUCTION: This exam was performed according to the departmental dose-optimization program which includes automated exposure control, adjustment of the mA and/or kV according to patient size and/or use of iterative reconstruction technique. CONTRAST:  60 Omni 350 COMPARISON:  CT/CTA head and neck 04/23/2021 FINDINGS: CT HEAD FINDINGS Brain: There is  no acute intracranial hemorrhage, extra-axial fluid collection, or acute infarct. Parenchymal volume is normal. The ventricles are normal in size. Gray-white differentiation is preserved There is no mass lesion. There is no mass effect or midline shift. Vascular:  See below. Skull: Normal. Negative for fracture or focal lesion. Sinuses/Orbits: The paranasal sinuses are clear. Left lens implant and scleral buckle are noted. The globes and orbits are otherwise unremarkable. Other: None. ASPECTS (Alberta Stroke Program Early CT Score) - Ganglionic level infarction (caudate, lentiform nuclei, internal capsule, insula, M1-M3 cortex): 7 - Supraganglionic infarction (M4-M6 cortex): 3 Total score (0-10 with 10 being normal): 10 Review of the MIP images confirms the above findings CTA NECK FINDINGS Aortic arch: The imaged aortic arch is normal. The origins of the major branch vessels are patent. The subclavian arteries are patent to the level imaged. Right carotid system: The right common, internal, and external carotid arteries are patent, without hemodynamically significant stenosis or occlusion. There is no dissection or aneurysm. Left carotid system: The left common, internal, and external carotid arteries are patent, without hemodynamically significant stenosis or occlusion. There is no dissection or aneurysm. Vertebral arteries: The vertebral arteries are patent, without hemodynamically significant stenosis or occlusion. There is no dissection or aneurysm. Skeleton: There is no acute osseous abnormality or suspicious osseous lesion. 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 There is no visible canal hematoma. Other neck: The soft tissues of the neck are unremarkable. Upper chest: The imaged lung apices are clear. A left chest wall cardiac device and associated leads are partially imaged. Review of the MIP images confirms the above findings CTA HEAD FINDINGS Anterior circulation: The intracranial ICAs are  patent. The bilateral MCAs are patent. The bilateral ACAs are patent. The anterior communicating artery is normal. There is no aneurysm or AVM. Posterior circulation: The bilateral V4 segments are patent. The basilar artery is patent. The major cerebellar arteries are patent. The bilateral PCAs are patent. There is a fetal origin of the right PCA. A left posterior communicating artery is not identified. There is no aneurysm or AVM. Venous sinuses: Patent. Anatomic variants: As above. Review of the MIP images confirms the above findings CT Brain Perfusion Findings: ASPECTS: 10 CBF (<30%) Volume: 0mL Perfusion (Tmax>6.0s) volume: 0mL Mismatch Volume: 0mL Infarction Location:N/a IMPRESSION: 1. No acute intracranial pathology. 2. Normal vasculature of the head and neck with no hemodynamically significant stenosis, occlusion, or dissection. 3. No infarct core or penumbra on CT perfusion. These results were called by telephone at the time of interpretation on 06/11/2022 at 10:58 am to provider Dr Otelia Limes, who verbally acknowledged these results. Electronically Signed   By: Lesia Hausen M.D.   On: 06/11/2022 11:19    Procedures Procedures    Medications Ordered in ED Medications  0.9 %  sodium chloride infusion ( Intravenous Infusion Verify 06/11/22 1351)  acetaminophen (TYLENOL) tablet 650 mg (has no administration in time range)    Or  acetaminophen (TYLENOL) 160 MG/5ML solution 650 mg (has no administration in time range)    Or  acetaminophen (TYLENOL) suppository 650 mg (has no administration in time range)  labetalol (NORMODYNE) injection 10 mg (has no administration in time range)    And  nicardipine (CARDENE)  in 0.86% saline IV infusion (0.1 mg/ml) (has no administration in time range)  Chlorhexidine Gluconate Cloth 2 % PADS 6 each (6 each Topical Given 06/11/22 1228)  oxyCODONE-acetaminophen (PERCOCET/ROXICET) 5-325 MG per tablet 1 tablet (1 tablet Oral Given 06/11/22 1302)    And   oxyCODONE (Oxy IR/ROXICODONE) immediate release tablet 5 mg (has no administration in time range)  pantoprazole (PROTONIX) EC tablet 40 mg (has no administration in time range)  iohexol (OMNIPAQUE) 350 MG/ML injection 100  mL (100 mLs Intravenous Contrast Given 06/11/22 1106)  tenecteplase (TNKASE) injection for Stroke 20 mg (20 mg Intravenous Given 06/11/22 1110)   stroke: early stages of recovery book ( Does not apply Given 06/11/22 1228)    ED Course/ Medical Decision Making/ A&P                           Medical Decision Making Amount and/or Complexity of Data Reviewed Labs: ordered. Radiology: ordered.  Risk Decision regarding hospitalization.   Patient presented a code stroke.  Neurodeficits with left-sided findings.  Differential diagnosis includes stroke, complicated migraine, seizure, intracranial hemorrhage.  Has had migraines in the past but reported not had neurodeficits with it.  Has been noncompliant with his Xarelto.  Initial head CT reassuring and independently interpreted by me.  CTA also done.  After discussion with patient decision made to give TNK.  With headache differential diagnosis does include complicated migraine, however cannot get a stat MRI with the patient's pacemaker.  Relatively lower risk of bleed if there is no stroke.  will end up being admitted to ICU.  CRITICAL CARE Performed by: Benjiman Core Total critical care time: 30 minutes Critical care time was exclusive of separately billable procedures and treating other patients. Critical care was necessary to treat or prevent imminent or life-threatening deterioration. Critical care was time spent personally by me on the following activities: development of treatment plan with patient and/or surrogate as well as nursing, discussions with consultants, evaluation of patient's response to treatment, examination of patient, obtaining history from patient or surrogate, ordering and performing treatments and  interventions, ordering and review of laboratory studies, ordering and review of radiographic studies, pulse oximetry and re-evaluation of patient's condition.           Final Clinical Impression(s) / ED Diagnoses Final diagnoses:  Cerebrovascular accident (CVA), unspecified mechanism (HCC)    Rx / DC Orders ED Discharge Orders     None         Benjiman Core, MD 06/11/22 1520

## 2022-06-11 NOTE — Progress Notes (Signed)
Received calls x2 from the RN-1 for nausea 1 for continuing headache. Ordered Zofran initially for the nausea.  But given continued headache, will get stat CT head given he is post TNK. Continue current pain management in addition to Tylenol  -- Amie Portland, MD Neurologist [facility] Pager: [phone]

## 2022-06-11 NOTE — Progress Notes (Signed)
EEG complete - results pending 

## 2022-06-12 ENCOUNTER — Inpatient Hospital Stay (HOSPITAL_COMMUNITY): Payer: Medicaid Other

## 2022-06-12 DIAGNOSIS — I6389 Other cerebral infarction: Secondary | ICD-10-CM

## 2022-06-12 DIAGNOSIS — R569 Unspecified convulsions: Secondary | ICD-10-CM | POA: Diagnosis not present

## 2022-06-12 LAB — CBC
HCT: 27.3 % — ABNORMAL LOW (ref 39.0–52.0)
Hemoglobin: 8.2 g/dL — ABNORMAL LOW (ref 13.0–17.0)
MCH: 23.6 pg — ABNORMAL LOW (ref 26.0–34.0)
MCHC: 30 g/dL (ref 30.0–36.0)
MCV: 78.4 fL — ABNORMAL LOW (ref 80.0–100.0)
Platelets: 187 10*3/uL (ref 150–400)
RBC: 3.48 MIL/uL — ABNORMAL LOW (ref 4.22–5.81)
RDW: 15.4 % (ref 11.5–15.5)
WBC: 3.3 10*3/uL — ABNORMAL LOW (ref 4.0–10.5)
nRBC: 0 % (ref 0.0–0.2)

## 2022-06-12 LAB — BASIC METABOLIC PANEL
Anion gap: 12 (ref 5–15)
BUN: 9 mg/dL (ref 6–20)
CO2: 22 mmol/L (ref 22–32)
Calcium: 7.9 mg/dL — ABNORMAL LOW (ref 8.9–10.3)
Chloride: 107 mmol/L (ref 98–111)
Creatinine, Ser: 0.7 mg/dL (ref 0.61–1.24)
GFR, Estimated: >60 mL/min (ref >60)
Glucose, Bld: 133 mg/dL — ABNORMAL HIGH (ref 70–99)
Potassium: 3.6 mmol/L (ref 3.5–5.1)
Sodium: 141 mmol/L (ref 135–145)

## 2022-06-12 LAB — LIPID PANEL
Cholesterol: 79 mg/dL (ref 0–200)
HDL: 38 mg/dL — ABNORMAL LOW (ref >40)
LDL Cholesterol: 36 mg/dL (ref 0–99)
Total CHOL/HDL Ratio: 2.1 RATIO
Triglycerides: 24 mg/dL (ref <150)
VLDL: 5 mg/dL (ref 0–40)

## 2022-06-12 MED ORDER — SODIUM CHLORIDE 0.9 % IV BOLUS
1000.0000 mL | Freq: Once | INTRAVENOUS | Status: AC
  Administered 2022-06-12: 1000 mL via INTRAVENOUS

## 2022-06-12 MED ORDER — SODIUM CHLORIDE 0.9 % IV BOLUS
250.0000 mL | Freq: Once | INTRAVENOUS | Status: AC
  Administered 2022-06-12: 250 mL via INTRAVENOUS

## 2022-06-12 MED ORDER — ORAL CARE MOUTH RINSE: 15.0000 mL | OROMUCOSAL | Status: DC | PRN

## 2022-06-12 MED ORDER — RIVAROXABAN 20 MG PO TABS
20.0000 mg | ORAL_TABLET | Freq: Every day | ORAL | Status: DC
  Administered 2022-06-12 – 2022-06-14 (×3): 20 mg via ORAL
  Filled 2022-06-12 (×3): qty 1

## 2022-06-12 MED ORDER — PNEUMOCOCCAL 20-VAL CONJ VACC 0.5 ML IM SUSY: 0.5000 mL | PREFILLED_SYRINGE | INTRAMUSCULAR | Status: DC | PRN

## 2022-06-12 MED ORDER — STROKE: EARLY STAGES OF RECOVERY BOOK
Status: AC
  Filled 2022-06-12: qty 1

## 2022-06-12 MED ORDER — SODIUM CHLORIDE 0.9 % IV BOLUS: 1000.0000 mL | Freq: Once | INTRAVENOUS | Status: DC

## 2022-06-12 MED ORDER — INFLUENZA VAC SPLIT QUAD 0.5 ML IM SUSY: 0.5000 mL | PREFILLED_SYRINGE | INTRAMUSCULAR | Status: DC | PRN

## 2022-06-12 NOTE — Progress Notes (Addendum)
Received patient from ICU. Upon initial assessment, patient is complaint of 7/10 chest pain. Describes as tightness and tingling down L arm. States that this began about 45 min ago  (2130).   Alerted Dr Rory Percy (neuro) to CP and EKG results, no new orders received.

## 2022-06-12 NOTE — Evaluation (Signed)
Occupational Therapy Evaluation Patient Details Name: Richard Benson MRN: [id number] DOB: [birth year] Today's Date: 06/12/2022   History of Present Illness 46 y.o. male presents to [facility] hospital on 06/11/2022 with HA, R facial droop and L weakness. PT also with a history of seizures and demonstrated RUE shaking during EMS trip. CT and CTA negative, pt received tNK. PMH includes blind left eye, CHF, CVA, afib, seizures.   Clinical Impression   PTA, pt was living with his wife and was independent; ADLs, IADLs, driving, and working "odd jobs".  Pt requiring Min A for ADLs and functional mobility with RW. Pt presenting with decreased functional use of LUE. However, strength and coordination inconsistent between formal assessment and functional tasks. Pt would benefit from further acute OT to facilitate safe dc. Recommend dc to home with HHOT for further OT to optimize safety, independence with ADLs, and return to PLOF.      Recommendations for follow up therapy are one component of a multi-disciplinary discharge planning process, led by the attending physician.  Recommendations may be updated based on patient status, additional functional criteria and insurance authorization.   Follow Up Recommendations  Home health OT    Assistance Recommended at Discharge Frequent or constant Supervision/Assistance  Patient can return home with the following      Functional Status Assessment  Patient has had a recent decline in their functional status and demonstrates the ability to make significant improvements in function in a reasonable and predictable amount of time.  Equipment Recommendations  BSC/3in1    Recommendations for Other Services PT consult     Precautions / Restrictions Precautions Precautions: Fall Restrictions Weight Bearing Restrictions: No      Mobility Bed Mobility Overal bed mobility: Needs Assistance Bed Mobility: Supine to Sit     Supine to sit: Supervision           Transfers Overall transfer level: Needs assistance Equipment used: Rolling walker (2 wheels) Transfers: Sit to/from Stand, Bed to chair/wheelchair/BSC Sit to Stand: Min assist     Step pivot transfers: Min assist            Balance Overall balance assessment: Needs assistance Sitting-balance support: No upper extremity supported, Feet supported Sitting balance-Leahy Scale: Good     Standing balance support: Single extremity supported, Reliant on assistive device for balance Standing balance-Leahy Scale: Poor                             ADL either performed or assessed with clinical judgement   ADL Overall ADL's : Needs assistance/impaired Eating/Feeding: Minimal assistance;Sitting Eating/Feeding Details (indicate cue type and reason): difficulty with opening bag of chips due to decreased active movement of LUE Grooming: Minimal assistance Grooming Details (indicate cue type and reason): min a for bilateral coordination Upper Body Bathing: Minimal assistance;Sitting   Lower Body Bathing: Minimal assistance;Sit to/from stand   Upper Body Dressing : Minimal assistance;Sitting   Lower Body Dressing: Minimal assistance;Sit to/from stand Lower Body Dressing Details (indicate cue type and reason): pt donning his socks by bringing ankles up to knees with RUE and donning one handed with RUE. Educating on adaptive techniques for donning socks Toilet Transfer: Minimal assistance;Ambulation;Rolling walker (2 wheels)           Functional mobility during ADLs: Minimal assistance;Rolling walker (2 wheels) General ADL Comments: Pt presenting wiht decreased active movement of LUE     Vision Baseline Vision/History: 2 Legally blind Additional Comments:  Reports that he can see "shadows" in L eye. Clearly can see through right to read clock. Dysconjugate gaze throughout; mainly focusing with R eye and moments of L eye. reports blurry and double vision during "epiosde"      Perception     Praxis      Pertinent Vitals/Pain Pain Assessment Pain Assessment: No/denies pain     Hand Dominance Right   Extremity/Trunk Assessment Upper Extremity Assessment Upper Extremity Assessment: LUE deficits/detail LUE Deficits / Details: inconsistencies observed in L side strength, pt initially unable to grip with L hand during formal assessment, later able to grip walker consistently and bear weight through left arm without loss of grip or buckling of UE. Pt presenting with difficulty performing active ROM at hand, wrist, elbow, and shoulder - however, inconcsisteny on how much active assist needed during ROM LUE Sensation: decreased light touch LUE Coordination: decreased fine motor;decreased gross motor   Lower Extremity Assessment Lower Extremity Assessment: Defer to PT evaluation LLE Deficits / Details: inconsistencies in strength observed during session. Pt initially able to minimally lift LLE off bed with increased effort, <3/5 grossly. during gait training pt initially dragging L foot but with further distances foot consistently clears and LLE advances consistently and fluidly   Cervical / Trunk Assessment Cervical / Trunk Assessment: Normal   Communication Communication Communication: No difficulties   Cognition Arousal/Alertness: Awake/alert Behavior During Therapy: WFL for tasks assessed/performed, Flat affect Overall Cognitive Status: Within Functional Limits for tasks assessed                                       General Comments  hypotensive with BP of 92/67 pre-mobility, 84/67 during ambulation, 110/72 post-mobility. Pt reports some dizziness when ambulating.    Exercises     Shoulder Instructions      Home Living Family/patient expects to be discharged to:: Private residence Living Arrangements: Spouse/significant other Available Help at Discharge: Family;Available PRN/intermittently Type of Home: House Home Access: Stairs  to enter [employer] of Steps: 2 Entrance Stairs-Rails:  (cant remember which side) Home Layout: One level     Bathroom Shower/Tub: Occupational psychologist: Handicapped height     Home Equipment: Cane - single point          Prior Functioning/Environment Prior Level of Function : Independent/Modified Independent             Mobility Comments: no DME ADLs Comments: ADLs, IADLs, driving, and doing odd jobs remodeling homes.        OT Problem List: Decreased strength;Decreased range of motion;Decreased activity tolerance;Impaired balance (sitting and/or standing);Decreased safety awareness;Decreased knowledge of use of DME or AE;Decreased knowledge of precautions;Impaired UE functional use      OT Treatment/Interventions: Self-care/ADL training;Therapeutic exercise;Energy conservation;DME and/or AE instruction;Therapeutic activities;Patient/family education    OT Goals(Current goals can be found in the care plan section) Acute Rehab OT Goals Patient Stated Goal: Get stronger OT Goal Formulation: With patient Time For Goal Achievement: 06/26/22 Potential to Achieve Goals: Good  OT Frequency: Min 2X/week    Co-evaluation PT/OT/SLP Co-Evaluation/Treatment: Yes Reason for Co-Treatment: For patient/therapist safety;To address functional/ADL transfers PT goals addressed during session: Mobility/safety with mobility;Balance;Proper use of DME;Strengthening/ROM OT goals addressed during session: ADL's and self-care      AM-PAC OT "6 Clicks" Daily Activity     Outcome Measure Help from another person eating meals?: A Little Help from  another person taking care of personal grooming?: A Little Help from another person toileting, which includes using toliet, bedpan, or urinal?: A Little Help from another person bathing (including washing, rinsing, drying)?: A Little Help from another person to put on and taking off regular upper body clothing?: A  Little Help from another person to put on and taking off regular lower body clothing?: A Little 6 Click Score: 18   End of Session Equipment Utilized During Treatment: Rolling walker (2 wheels) Nurse Communication: Mobility status  Activity Tolerance: Patient tolerated treatment well Patient left: in chair;with call bell/phone within reach;with chair alarm set  OT Visit Diagnosis: Unsteadiness on feet (R26.81);Other abnormalities of gait and mobility (R26.89);Muscle weakness (generalized) (M62.81);Pain                Time: 0174-9449 OT Time Calculation (min): 25 min Charges:  OT General Charges $OT Visit: 1 Visit OT Evaluation $OT Eval Moderate Complexity: 1 Mod  Jatorian Renault MSOT, OTR/L Acute Rehab Office: [phone]  Theodoro Grist Devinn Hurwitz 06/12/2022, 1:49 PM

## 2022-06-12 NOTE — Evaluation (Signed)
Physical Therapy Evaluation Patient Details Name: Richard Benson MRN: [id number] DOB: 08-01-1976 Today's Date: 06/12/2022  History of Present Illness  46 y.o. male presents to [facility] hospital on 06/11/2022 with HA, R facial droop and L weakness. PT also with a history of seizures and demonstrated RUE shaking during EMS trip. CT and CTA negative, pt received tNK. PMH includes blind left eye, CHF, CVA, afib, seizures.  Clinical Impression  Pt presents to PT with deficits in strength, gait, balance, functional mobility, endurance, power, sensation. Pt with inconsistencies in L sided strength during session, with poor strength to formal assessment but greater strength observed during functional mobility. Pt does experience 3 posterior losses of balance when ambulating, and he will benefit from aggressive mobilization in an effort to improve gait quality and to restore independence. Pt will benefit from additional acute PT services prior to discharge, however PT believes the pt will progress quickly and be able to discharge home with outpatient PT pending transportation from his spouse.     Recommendations for follow up therapy are one component of a multi-disciplinary discharge planning process, led by the attending physician.  Recommendations may be updated based on patient status, additional functional criteria and insurance authorization.  Follow Up Recommendations Outpatient PT (outpatient neuro preferably)      Assistance Recommended at Discharge Intermittent Supervision/Assistance  Patient can return home with the following  A little help with walking and/or transfers;A little help with bathing/dressing/bathroom;Assistance with cooking/housework;Assist for transportation;Help with stairs or ramp for entrance    Equipment Recommendations Rolling walker (2 wheels);BSC/3in1  Recommendations for Other Services       Functional Status Assessment Patient has had a recent decline in their  functional status and demonstrates the ability to make significant improvements in function in a reasonable and predictable amount of time.     Precautions / Restrictions Precautions Precautions: Fall Restrictions Weight Bearing Restrictions: No      Mobility  Bed Mobility Overal bed mobility: Needs Assistance Bed Mobility: Supine to Sit     Supine to sit: Supervision     General bed mobility comments: pt hooking RLE under LLE to assist. Pt does utilize LUE to push into sitting despite significant weakness to formal MMT    Transfers Overall transfer level: Needs assistance Equipment used: Rolling walker (2 wheels) Transfers: Sit to/from Stand, Bed to chair/wheelchair/BSC Sit to Stand: Min assist   Step pivot transfers: Min assist            Ambulation/Gait Ambulation/Gait assistance: Herbalist (Feet): 40 Feet Assistive device: Rolling walker (2 wheels) Gait Pattern/deviations: Step-through pattern, Decreased dorsiflexion - left Gait velocity: reduced Gait velocity interpretation: <1.8 ft/sec, indicate of risk for recurrent falls   General Gait Details: pt with slowed step-through, increased effort to advance LLE with foot drag initially progressing to more fluid step-through gait without foot drag. pt does experience 3 posterior losses of balance, minA from PT to correct  Stairs            Wheelchair Mobility    Modified Rankin (Stroke Patients Only) Modified Rankin (Stroke Patients Only) Pre-Morbid Rankin Score: No symptoms Modified Rankin: Moderately severe disability     Balance Overall balance assessment: Needs assistance Sitting-balance support: No upper extremity supported, Feet supported Sitting balance-Leahy Scale: Good     Standing balance support: Single extremity supported, Reliant on assistive device for balance Standing balance-Leahy Scale: Poor  Pertinent Vitals/Pain Pain  Assessment Pain Assessment: No/denies pain    Home Living Family/patient expects to be discharged to:: Private residence Living Arrangements: Spouse/significant other Available Help at Discharge: Family;Available PRN/intermittently Type of Home: House Home Access: Stairs to enter Entrance Stairs-Rails:  (one rail, unsure of side) Entrance Stairs-Number of Steps: 2   Home Layout: One level Home Equipment: Cane - single point      Prior Function Prior Level of Function : Independent/Modified Independent             Mobility Comments: no use of DME, was still working odd Architect jobs ADLs Comments: ADLs, IADLs, driving, and doing odd jobs remodeling homes.     Hand Dominance   Dominant Hand: Right    Extremity/Trunk Assessment   Upper Extremity Assessment Upper Extremity Assessment: LUE deficits/detail LUE Deficits / Details: inconsistencies observed in L side strength, pt initially unable to grip with L hand for OT during formal assessment, later able to grip walker consistently and bear weight through left arm without loss of grip or buckling of UE LUE Sensation: decreased light touch    Lower Extremity Assessment Lower Extremity Assessment: LLE deficits/detail LLE Deficits / Details: inconsistencies in strength observed during session. Pt initially able to minimally lift LLE off bed with increased effort, <3/5 grossly. during gait training pt initially dragging L foot but with further distances foot consistently clears and LLE advances consistently and fluidly    Cervical / Trunk Assessment Cervical / Trunk Assessment: Normal  Communication   Communication: No difficulties  Cognition Arousal/Alertness: Awake/alert Behavior During Therapy: WFL for tasks assessed/performed, Flat affect Overall Cognitive Status: Within Functional Limits for tasks assessed                                          General Comments General comments (skin  integrity, edema, etc.): hypotensive with BP of 92/67 pre-mobility, 84/67 during ambulation, 110/72 post-mobility. Pt reports some dizziness when ambulating.    Exercises     Assessment/Plan    PT Assessment Patient needs continued PT services  PT Problem List Decreased strength;Decreased activity tolerance;Decreased balance;Decreased mobility       PT Treatment Interventions DME instruction;Gait training;Stair training;Functional mobility training;Therapeutic activities;Therapeutic exercise;Balance training;Neuromuscular re-education;Patient/family education    PT Goals (Current goals can be found in the Care Plan section)  Acute Rehab PT Goals Patient Stated Goal: to regain strength, return to independence PT Goal Formulation: With patient Time For Goal Achievement: 06/26/22 Potential to Achieve Goals: Good    Frequency Min 4X/week     Co-evaluation PT/OT/SLP Co-Evaluation/Treatment: Yes Reason for Co-Treatment: Complexity of the patient's impairments (multi-system involvement);Necessary to address cognition/behavior during functional activity;For patient/therapist safety;To address functional/ADL transfers PT goals addressed during session: Mobility/safety with mobility;Balance;Proper use of DME;Strengthening/ROM         AM-PAC PT "6 Clicks" Mobility  Outcome Measure Help needed turning from your back to your side while in a flat bed without using bedrails?: A Little Help needed moving from lying on your back to sitting on the side of a flat bed without using bedrails?: A Little Help needed moving to and from a bed to a chair (including a wheelchair)?: A Little Help needed standing up from a chair using your arms (e.g., wheelchair or bedside chair)?: A Little Help needed to walk in hospital room?: A Little Help needed climbing 3-5 steps with a railing? :  A Lot 6 Click Score: 17    End of Session   Activity Tolerance: Patient tolerated treatment well Patient left: in  chair;with call bell/phone within reach;with chair alarm set Nurse Communication: Mobility status PT Visit Diagnosis: Other abnormalities of gait and mobility (R26.89);Other symptoms and signs involving the nervous system (R29.898);Muscle weakness (generalized) (M62.81)    Time: [id number] PT Time Calculation (min) (ACUTE ONLY): 30 min   Charges:   PT Evaluation $PT Eval Low Complexity: [place], PT, DPT Acute Rehabilitation Office [phone]   Zenaida Niece 06/12/2022, 1:26 PM

## 2022-06-12 NOTE — Progress Notes (Signed)
ANTICOAGULATION CONSULT NOTE - Initial Consult  Pharmacy Consult for Xarelto  Indication: atrial fibrillation  No Active Allergies  Patient Measurements: Height: 5\' 11"  (180.3 cm) Weight: 81 kg (178 lb 9.2 oz) IBW/kg (Calculated) : 75.3  Vital Signs: Temp: 98 F (36.7 C) (11/04 1200) Temp Source: Axillary (11/04 1200) BP: 94/71 (11/04 1300) Pulse Rate: 63 (11/04 1300)  Labs: Recent Labs    06/11/22 1049 06/11/22 1054 06/12/22 0610  HGB 8.9* 9.2* 8.2*  HCT 28.6* 27.0* 27.3*  PLT 221  --  187  APTT 32  --   --   LABPROT 18.0*  --   --   INR 1.5*  --   --   CREATININE 0.86 0.70 0.70    Estimated Creatinine Clearance: 120.3 mL/min (by C-G formula based on SCr of 0.7 mg/dL).   Medical History: Past Medical History:  Diagnosis Date   Blind left eye    CHF (congestive heart failure) (HCC)    Obesity    Stroke (HCC)     Medications:  Medications Prior to Admission  Medication Sig Dispense Refill Last Dose   acetaminophen (TYLENOL) 500 MG tablet Take 500-1,000 mg by mouth every 6 (six) hours as needed (for pain).      aspirin EC 81 MG tablet Take 1 tablet (81 mg total) by mouth daily. 30 tablet 6    atorvastatin (LIPITOR) 40 MG tablet Take 1 tablet (40 mg total) by mouth daily at 6 PM. 30 tablet 6    baclofen (LIORESAL) 10 MG tablet Take 10 mg by mouth at bedtime as needed for muscle spasms.      busPIRone (BUSPAR) 10 MG tablet Take 10 mg by mouth 2 (two) times daily.      Cholecalciferol (VITAMIN D3) 5000 units CAPS Take 5,000 Units by mouth daily.      Cyanocobalamin (VITAMIN B-12 PO) Take 1 tablet by mouth daily.      diclofenac sodium (VOLTAREN) 1 % GEL Apply 2 g topically 4 (four) times daily as needed (pain).      gabapentin (NEURONTIN) 300 MG capsule Take 300 mg by mouth 3 (three) times daily.      levETIRAcetam (KEPPRA) 750 MG tablet Take 1 tablet (750 mg total) by mouth 2 (two) times daily. 60 tablet 6    Multiple Vitamin (MULTIVITAMIN WITH MINERALS) TABS  tablet Take 1 tablet by mouth daily.      nitroGLYCERIN (NITROSTAT) 0.4 MG SL tablet Place 0.4 mg under the tongue every 5 (five) minutes as needed for chest pain.       ondansetron (ZOFRAN-ODT) 4 MG disintegrating tablet Take 4 mg by mouth 3 (three) times daily as needed for nausea or vomiting.      oxyCODONE-acetaminophen (PERCOCET) 10-325 MG tablet Take 1 tablet by mouth every 6 (six) hours as needed for pain.       pantoprazole (PROTONIX) 40 MG tablet Take 40 mg by mouth 2 (two) times daily.      promethazine (PHENERGAN) 25 MG tablet Take 25 mg by mouth every 6 (six) hours as needed for nausea or vomiting.      rivaroxaban (XARELTO) 20 MG TABS tablet Take 1 tablet (20 mg total) by mouth daily with supper. 30 tablet     torsemide (DEMADEX) 20 MG tablet Take 20 mg by mouth every other day as needed (swelling).      traZODone (DESYREL) 50 MG tablet Take 50-100 mg by mouth at bedtime as needed for sleep.  Assessment: Patient admitted for stroke, received TNK 11/3 at 11:09. Patient on Xarelto 20mg  at home. Consult to restart Xarelto for atrial fibrillation.   Goal of Therapy:  Monitor platelets by anticoagulation protocol: Yes   Plan:  Start Xarelto 20mg  once daily.  Monitor renal function.  Monitor H & H for signs and symptoms of bleeding.   Ventura Sellers 06/12/2022,1:46 PM

## 2022-06-12 NOTE — Procedures (Signed)
Patient Name: Richard Benson  MRN: [id number]  Epilepsy Attending: Lora Havens  Referring Physician/Provider: Janine Ores, NP  Date: 06/11/2022 Duration: 21.44 mins  Patient history: 46yo M with headache, upper extremity shaking with head shaking. EEG to evaluate for seizure  Level of alertness: Awake, asleep  AEDs during EEG study: LEV, GBP  Technical aspects: This EEG study was done with scalp electrodes positioned according to the 10-20 International system of electrode placement. Electrical activity was reviewed with band pass filter of 1-70Hz , sensitivity of 7 uV/mm, display speed of 91mm/sec with a 60Hz  notched filter applied as appropriate. EEG data were recorded continuously and digitally stored.  Video monitoring was available and reviewed as appropriate.  Description: The posterior dominant rhythm consists of 9 Hz activity of moderate voltage (25-35 uV) seen predominantly in posterior head regions, symmetric and reactive to eye opening and eye closing. Sleep was characterized by vertex waves, sleep spindles (12 to 14 Hz), maximal frontocentral region. Hyperventilation and photic stimulation were not performed.     IMPRESSION: This study is within normal limits. No seizures or epileptiform discharges were seen throughout the recording.  A normal interictal EEG does not exclude the diagnosis of epilepsy.  Alisse Tuite Barbra Sarks

## 2022-06-12 NOTE — Progress Notes (Addendum)
STROKE TEAM PROGRESS NOTE   INTERVAL HISTORY No family at the bedside. Blind in left eye. Dysconjugate gaze tracks. PERRl.  Left arm able to lift antigravity but drifts to bed. Left leg able to lift but drifts down in the 30s variable able to name and repeat sensation decreased on left  CTH at 1100 for 24 hr imaging- no acute infarct.  Required fluid bolus overnight for hypotension. BP has been stable since.  Will transfer out of the unit to the floor   Vitals:   06/12/22 0600 06/12/22 0700 06/12/22 0800 06/12/22 0830  BP: (!) 98/59 (!) 89/58 (!) 86/73 (!) 97/56  Pulse: 64 60 63 61  Resp: 13 13 11 10   Temp:      TempSrc:      SpO2: 98% 99% 98% 96%  Weight:      Height:       CBC:  Recent Labs  Lab 06/11/22 1049 06/11/22 1054 06/12/22 0610  WBC 3.9*  --  3.3*  NEUTROABS 2.1  --   --   HGB 8.9* 9.2* 8.2*  HCT 28.6* 27.0* 27.3*  MCV 76.9*  --  78.4*  PLT 221  --  [id number]   Basic Metabolic Panel:  Recent Labs  Lab 06/11/22 1049 06/11/22 1054 06/12/22 0610  NA 138 138 141  K 3.7 3.7 3.6  CL 106 103 107  CO2 23  --  22  GLUCOSE 98 97 133*  BUN 10 10 9   CREATININE 0.86 0.70 0.70  CALCIUM 8.1*  --  7.9*   Lipid Panel:  Recent Labs  Lab 06/12/22 0604  CHOL 79  TRIG 24  HDL 38*  CHOLHDL 2.1  VLDL 5  LDLCALC 36   HgbA1c:  Recent Labs  Lab 06/11/22 1224  HGBA1C 5.2   Urine Drug Screen: No results for input(s): "LABOPIA", "COCAINSCRNUR", "LABBENZ", "AMPHETMU", "THCU", "LABBARB" in the last 168 hours.  Alcohol Level  Recent Labs  Lab 06/11/22 1049  ETH <10    IMAGING past 24 hours EEG adult  Result Date: 06/12/2022 Lora Havens, MD     06/12/2022  7:20 AM Patient Name: Richard Benson MRN: [id number] Epilepsy Attending: Lora Havens Referring Physician/Provider: Janine Ores, NP Date: 06/11/2022 Duration: 21.44 mins Patient history: 46yo M with headache, upper extremity shaking with head shaking. EEG to evaluate for seizure Level of alertness:  Awake, asleep AEDs during EEG study: LEV, GBP Technical aspects: This EEG study was done with scalp electrodes positioned according to the 10-20 International system of electrode placement. Electrical activity was reviewed with band pass filter of 1-70Hz , sensitivity of 7 uV/mm, display speed of 72mm/sec with a 60Hz  notched filter applied as appropriate. EEG data were recorded continuously and digitally stored.  Video monitoring was available and reviewed as appropriate. Description: The posterior dominant rhythm consists of 9 Hz activity of moderate voltage (25-35 uV) seen predominantly in posterior head regions, symmetric and reactive to eye opening and eye closing. Sleep was characterized by vertex waves, sleep spindles (12 to 14 Hz), maximal frontocentral region. Hyperventilation and photic stimulation were not performed.   IMPRESSION: This study is within normal limits. No seizures or epileptiform discharges were seen throughout the recording. A normal interictal EEG does not exclude the diagnosis of epilepsy. Priyanka Barbra Sarks   CT HEAD WO CONTRAST (5MM)  Result Date: 06/11/2022 CLINICAL DATA:  Follow-up examination for stroke. Continued headache status post TNK. EXAM: CT HEAD WITHOUT CONTRAST TECHNIQUE: Contiguous axial images were obtained from the  base of the skull through the vertex without intravenous contrast. RADIATION DOSE REDUCTION: This exam was performed according to the departmental dose-optimization program which includes automated exposure control, adjustment of the mA and/or kV according to patient size and/or use of iterative reconstruction technique. COMPARISON:  Prior CT from earlier the same day. FINDINGS: Brain: No acute intracranial hemorrhage. No definite or convincing large vessel territory infarct. No mass lesion, midline shift or mass effect. No hydrocephalus or extra-axial fluid collection. Vascular: No abnormal hyperdense vessel. Skull: Scalp soft tissues and calvarium demonstrate  no new finding. Sinuses/Orbits: Globes orbital soft tissues demonstrate no acute finding. Postoperative changes noted about the left lobe. Mild pneumatized secretions noted within the right sphenoid sinus. No significant mastoid effusion. Other: None. IMPRESSION: Stable head CT. No acute intracranial hemorrhage or other abnormality. Electronically Signed   By: Jeannine Boga M.D.   On: 06/11/2022 23:09   ECHOCARDIOGRAM COMPLETE  Result Date: 06/11/2022    ECHOCARDIOGRAM REPORT   Patient Name:   Richard Benson Date of Exam: 06/11/2022 Medical Rec #:  [id number]           Height:       71.0 in Accession #:    [id number]          Weight:       178.6 lb Date of Birth:  [birth year]           BSA:          2.009 m Patient Age:    46 years            BP:           103/76 mmHg Patient Gender: M                   HR:           61 bpm. Exam Location:  Inpatient Procedure: 2D Echo, Cardiac Doppler and Color Doppler Indications:    Stroke  History:        Patient has prior history of Echocardiogram examinations, most                 recent 12/15/2008. Abnormal ECG and Pacemaker, Stroke,                 Signs/Symptoms:Syncope; Risk Factors:Dyslipidemia.  Sonographer:    Roseanna Rainbow RDCS Referring Phys: [id number] [facility]  Sonographer Comments: Technically difficult study due to poor echo windows. IMPRESSIONS  1. Left ventricular ejection fraction, by estimation, is 60 to 65%. The left ventricle has normal function. The left ventricle has no regional wall motion abnormalities. Left ventricular diastolic parameters were normal.  2. Right ventricular systolic function is normal. The right ventricular size is normal. There is normal pulmonary artery systolic pressure.  3. The mitral valve is normal in structure. Trivial mitral valve regurgitation. No evidence of mitral stenosis.  4. The aortic valve is tricuspid. Aortic valve regurgitation is not visualized. No aortic stenosis is present.  5. The inferior vena cava is  dilated in size with <50% respiratory variability, suggesting right atrial pressure of 15 mmHg. Comparison(s): No prior Echocardiogram. FINDINGS  Left Ventricle: Left ventricular ejection fraction, by estimation, is 60 to 65%. The left ventricle has normal function. The left ventricle has no regional wall motion abnormalities. The left ventricular internal cavity size was normal in size. There is  no left ventricular hypertrophy. Left ventricular diastolic parameters were normal. Right Ventricle: The right ventricular size is normal. Right ventricular systolic function  is normal. There is normal pulmonary artery systolic pressure. The tricuspid regurgitant velocity is 2.06 m/s, and with an assumed right atrial pressure of 15 mmHg, the estimated right ventricular systolic pressure is 83.4 mmHg. Left Atrium: Left atrial size was normal in size. Right Atrium: Right atrial size was normal in size. Pericardium: There is no evidence of pericardial effusion. Mitral Valve: The mitral valve is normal in structure. Trivial mitral valve regurgitation. No evidence of mitral valve stenosis. Tricuspid Valve: The tricuspid valve is normal in structure. Tricuspid valve regurgitation is mild . No evidence of tricuspid stenosis. Aortic Valve: The aortic valve is tricuspid. Aortic valve regurgitation is not visualized. No aortic stenosis is present. Pulmonic Valve: The pulmonic valve was not well visualized. Pulmonic valve regurgitation is not visualized. No evidence of pulmonic stenosis. Aorta: The aortic root is normal in size and structure. Venous: The inferior vena cava is dilated in size with less than 50% respiratory variability, suggesting right atrial pressure of 15 mmHg. IAS/Shunts: No atrial level shunt detected by color flow Doppler. Additional Comments: A device lead is visualized.  LEFT VENTRICLE PLAX 2D LVIDd:         4.60 cm     Diastology LVIDs:         3.20 cm     LV e' medial:    17.40 cm/s LV PW:         0.80 cm      LV E/e' medial:  5.4 LV IVS:        0.90 cm     LV e' lateral:   18.20 cm/s LVOT diam:     2.60 cm     LV E/e' lateral: 5.2 LV SV:         108 LV SV Index:   54 LVOT Area:     5.31 cm  LV Volumes (MOD) LV vol d, MOD A2C: 77.7 ml LV vol d, MOD A4C: 84.2 ml LV vol s, MOD A2C: 25.5 ml LV vol s, MOD A4C: 31.1 ml LV SV MOD A2C:     52.2 ml LV SV MOD A4C:     84.2 ml LV SV MOD BP:      53.2 ml RIGHT VENTRICLE             IVC RV S prime:     14.00 cm/s  IVC diam: 2.60 cm TAPSE (M-mode): 3.0 cm LEFT ATRIUM             Index        RIGHT ATRIUM           Index LA diam:        2.60 cm 1.29 cm/m   RA Area:     15.10 cm LA Vol (A2C):   45.4 ml 22.59 ml/m  RA Volume:   43.60 ml  21.70 ml/m LA Vol (A4C):   32.4 ml 16.12 ml/m LA Biplane Vol: 39.4 ml 19.61 ml/m  AORTIC VALVE LVOT Vmax:   106.00 cm/s LVOT Vmean:  68.400 cm/s LVOT VTI:    0.204 m  AORTA Ao Root diam: 3.30 cm Ao Asc diam:  3.70 cm MITRAL VALVE               TRICUSPID VALVE MV Area (PHT): 3.66 cm    TR Peak grad:   17.0 mmHg MV Decel Time: 207 msec    TR Vmax:        206.00 cm/s MV E velocity: 94.12 cm/s MV A velocity: 82.02  cm/s  SHUNTS MV E/A ratio:  1.15        Systemic VTI:  0.20 m                            Systemic Diam: 2.60 cm Kirk Ruths MD Electronically signed by Kirk Ruths MD Signature Date/Time: 06/11/2022/2:44:34 PM    Final    CT HEAD CODE STROKE WO CONTRAST  Result Date: 06/11/2022 CLINICAL DATA:  Left-sided weakness, headache. EXAM: CT ANGIOGRAPHY HEAD AND NECK CT PERFUSION BRAIN TECHNIQUE: Multidetector CT imaging of the head and neck was performed using the standard protocol during bolus administration of intravenous contrast. Multiplanar CT image reconstructions and MIPs were obtained to evaluate the vascular anatomy. Carotid stenosis measurements (when applicable) are obtained utilizing NASCET criteria, using the distal internal carotid diameter as the denominator. Multiphase CT imaging of the brain was performed following IV  bolus contrast injection. Subsequent parametric perfusion maps were calculated using RAPID software. RADIATION DOSE REDUCTION: This exam was performed according to the departmental dose-optimization program which includes automated exposure control, adjustment of the mA and/or kV according to patient size and/or use of iterative reconstruction technique. CONTRAST:  60 Omni 350 COMPARISON:  CT/CTA head and neck 04/23/2021 FINDINGS: CT HEAD FINDINGS Brain: There is no acute intracranial hemorrhage, extra-axial fluid collection, or acute infarct. Parenchymal volume is normal. The ventricles are normal in size. Gray-white differentiation is preserved There is no mass lesion. There is no mass effect or midline shift. Vascular: See below. Skull: Normal. Negative for fracture or focal lesion. Sinuses/Orbits: The paranasal sinuses are clear. Left lens implant and scleral buckle are noted. The globes and orbits are otherwise unremarkable. Other: None. ASPECTS ([place] Stroke Program Early CT Score) - Ganglionic level infarction (caudate, lentiform nuclei, internal capsule, insula, M1-M3 cortex): 7 - Supraganglionic infarction (M4-M6 cortex): 3 Total score (0-10 with 10 being normal): 10 Review of the MIP images confirms the above findings CTA NECK FINDINGS Aortic arch: The imaged aortic arch is normal. The origins of the major branch vessels are patent. The subclavian arteries are patent to the level imaged. Right carotid system: The right common, internal, and external carotid arteries are patent, without hemodynamically significant stenosis or occlusion. There is no dissection or aneurysm. Left carotid system: The left common, internal, and external carotid arteries are patent, without hemodynamically significant stenosis or occlusion. There is no dissection or aneurysm. Vertebral arteries: The vertebral arteries are patent, without hemodynamically significant stenosis or occlusion. There is no dissection or aneurysm.  Skeleton: There is no acute osseous abnormality or suspicious osseous lesion. 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 There is no visible canal hematoma. Other neck: The soft tissues of the neck are unremarkable. Upper chest: The imaged lung apices are clear. A left chest wall cardiac device and associated leads are partially imaged. Review of the MIP images confirms the above findings CTA HEAD FINDINGS Anterior circulation: The intracranial ICAs are patent. The bilateral MCAs are patent. The bilateral ACAs are patent. The anterior communicating artery is normal. There is no aneurysm or AVM. Posterior circulation: The bilateral V4 segments are patent. The basilar artery is patent. The major cerebellar arteries are patent. The bilateral PCAs are patent. There is a fetal origin of the right PCA. A left posterior communicating artery is not identified. There is no aneurysm or AVM. Venous sinuses: Patent. Anatomic variants: As above. Review of the MIP images confirms the above findings CT Brain  Perfusion Findings: ASPECTS: 10 CBF (<30%) Volume: 30mL Perfusion (Tmax>6.0s) volume: 14mL Mismatch Volume: 52mL Infarction Location:N/a IMPRESSION: 1. No acute intracranial pathology. 2. Normal vasculature of the head and neck with no hemodynamically significant stenosis, occlusion, or dissection. 3. No infarct core or penumbra on CT perfusion. These results were called by telephone at the time of interpretation on 06/11/2022 at 10:58 am to provider Dr Cheral Marker, who verbally acknowledged these results. Electronically Signed   By: Valetta Mole M.D.   On: 06/11/2022 11:19   CT ANGIO HEAD NECK W WO CM W PERF (CODE STROKE)  Result Date: 06/11/2022 CLINICAL DATA:  Left-sided weakness, headache. EXAM: CT ANGIOGRAPHY HEAD AND NECK CT PERFUSION BRAIN TECHNIQUE: Multidetector CT imaging of the head and neck was performed using the standard protocol during bolus administration of intravenous contrast. Multiplanar CT image  reconstructions and MIPs were obtained to evaluate the vascular anatomy. Carotid stenosis measurements (when applicable) are obtained utilizing NASCET criteria, using the distal internal carotid diameter as the denominator. Multiphase CT imaging of the brain was performed following IV bolus contrast injection. Subsequent parametric perfusion maps were calculated using RAPID software. RADIATION DOSE REDUCTION: This exam was performed according to the departmental dose-optimization program which includes automated exposure control, adjustment of the mA and/or kV according to patient size and/or use of iterative reconstruction technique. CONTRAST:  60 Omni 350 COMPARISON:  CT/CTA head and neck 04/23/2021 FINDINGS: CT HEAD FINDINGS Brain: There is no acute intracranial hemorrhage, extra-axial fluid collection, or acute infarct. Parenchymal volume is normal. The ventricles are normal in size. Gray-white differentiation is preserved There is no mass lesion. There is no mass effect or midline shift. Vascular: See below. Skull: Normal. Negative for fracture or focal lesion. Sinuses/Orbits: The paranasal sinuses are clear. Left lens implant and scleral buckle are noted. The globes and orbits are otherwise unremarkable. Other: None. ASPECTS ([place] Stroke Program Early CT Score) - Ganglionic level infarction (caudate, lentiform nuclei, internal capsule, insula, M1-M3 cortex): 7 - Supraganglionic infarction (M4-M6 cortex): 3 Total score (0-10 with 10 being normal): 10 Review of the MIP images confirms the above findings CTA NECK FINDINGS Aortic arch: The imaged aortic arch is normal. The origins of the major branch vessels are patent. The subclavian arteries are patent to the level imaged. Right carotid system: The right common, internal, and external carotid arteries are patent, without hemodynamically significant stenosis or occlusion. There is no dissection or aneurysm. Left carotid system: The left common, internal, and  external carotid arteries are patent, without hemodynamically significant stenosis or occlusion. There is no dissection or aneurysm. Vertebral arteries: The vertebral arteries are patent, without hemodynamically significant stenosis or occlusion. There is no dissection or aneurysm. Skeleton: There is no acute osseous abnormality or suspicious osseous lesion. 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 There is no visible canal hematoma. Other neck: The soft tissues of the neck are unremarkable. Upper chest: The imaged lung apices are clear. A left chest wall cardiac device and associated leads are partially imaged. Review of the MIP images confirms the above findings CTA HEAD FINDINGS Anterior circulation: The intracranial ICAs are patent. The bilateral MCAs are patent. The bilateral ACAs are patent. The anterior communicating artery is normal. There is no aneurysm or AVM. Posterior circulation: The bilateral V4 segments are patent. The basilar artery is patent. The major cerebellar arteries are patent. The bilateral PCAs are patent. There is a fetal origin of the right PCA. A left posterior communicating artery is not identified. There  is no aneurysm or AVM. Venous sinuses: Patent. Anatomic variants: As above. Review of the MIP images confirms the above findings CT Brain Perfusion Findings: ASPECTS: 10 CBF (<30%) Volume: 70mL Perfusion (Tmax>6.0s) volume: 75mL Mismatch Volume: 74mL Infarction Location:N/a IMPRESSION: 1. No acute intracranial pathology. 2. Normal vasculature of the head and neck with no hemodynamically significant stenosis, occlusion, or dissection. 3. No infarct core or penumbra on CT perfusion. These results were called by telephone at the time of interpretation on 06/11/2022 at 10:58 am to provider Dr Cheral Marker, who verbally acknowledged these results. Electronically Signed   By: Valetta Mole M.D.   On: 06/11/2022 11:19    PHYSICAL EXAM Blind in left eye. Discongjuate gaze tracks.  PERRl.   able to name and repeat sensation intact.  Temp:  [97.4 F (36.3 C)-98 F (36.7 C)] 98 F (36.7 C) (11/04 1200) Pulse Rate:  [52-78] 63 (11/04 1300) Resp:  [8-21] 10 (11/04 0830) BP: (80-115)/(45-82) 94/71 (11/04 1300) SpO2:  [96 %-100 %] 99 % (11/04 1300)  General - Well nourished, well developed, in no apparent distress. Cardiovascular - Regular rhythm and rate.  Mental Status -  Level of arousal and orientation to time, place, and person were intact. Language including expression, naming, repetition, comprehension was assessed and found intact. Attention span and concentration were normal. Recent and remote memory were intact. Fund of Knowledge was assessed and was intact.  Cranial Nerves II - XII - II - Visual field intact OU.blind in left eye at baseline III, IV, VI - Extraocular movements intact, tracks. Disconjugate gaze  V - Facial sensation intact bilaterally. VII - Facial movement intact bilaterally. VIII - Hearing & vestibular intact bilaterally. X - Palate elevates symmetrically. XI - Chin turning & shoulder shrug intact bilaterally. XII - Tongue protrusion intact.  Motor Strength - Left arm able to lift antigravity but drifts to bed. Left leg able to lift but drifts down, effort is poor and variable right arm and leg 5/5 Motor Tone - Muscle tone was assessed at the neck and appendages and was normal.  Sensory - decreased on left   Coordination - The patient had normal movements in the hands and feet with no ataxia or dysmetria.  Tremor was absent.  Gait and Station - deferred.  ASSESSMENT/PLAN Richard Benson is a 46 y.o. 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  At approximately 0845 this morning he developed a severe headache, right facial  droop, and left sided weakness. He had previously been in his usual state of health and been able to go to the grocery store. He states that he has been on Xarelto since 2014, but has not taken it in a week due to running out. He has been compliant with his seizure medications. He reports a 9/10 right temporal headache reporting nausea, photophobia, sonophobia, and osmophobia. Additionally he does have a history of seizures and did demonstrate upper extremity shaking with head shaking during the EMS trip lasting approximately 10 seconds.    Complicated Migraine vs Stroke s/p TNK  CT head x2 negative for stroke.  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  ASPECTS 10.    CTA head & neck Normal vasculature of the head and neck with no hemodynamically significant  stenosis, occlusion, or dissection CT perfusion No infarct core or penumbra on CT perfusion  24 hr s/p TNK CT head negative for acute process MRI  unable to obtain due to PPM not compatible with MRI.  2D Echo EF 60-65% Trivial mitral valve regurgitation  LDL 36 HgbA1c 5.2 VTE prophylaxis - SCD's    Diet   Diet regular Room service appropriate? Yes with Assist; Fluid consistency: Thin   Xarelto (rivaroxaban) daily prior to admission, now on Xarelto (rivaroxaban) daily.  Therapy recommendations:  pending  Disposition:  pending  Hypertension Hypotension  Home meds:  none UnStable Hypotensive overnight requiring IVF bolus.  Long-term BP goal normotensive  Hyperlipidemia Home meds:  liptior 40mg , resumed in hospital LDL 36, goal < 70 Continue statin at discharge  P Atrial Fib  On Xarelto at home. Was out of medication off ~ 1 week  Will restart since no hemorrhage on 24 hr imaging    Seizure hx  Seizure precautions  On keppra, will continue  EEG This study is within normal limits. No seizures or epileptiform discharges were seen throughout the recording.   Other Stroke Risk  Factors Former Cigarette smoker Hx stroke/TIA Coronary artery disease Migraines Obstructive sleep apnea, on CPAP at home Congestive heart failure  Other Active Problems Anemia Hgb 8.2  Hospital day # [place] DNP, [facility]-AG  I have personally obtained history,examined this patient, reviewed notes, independently viewed imaging studies, participated in medical decision making and plan of care.ROS completed by me personally and pertinent positives fully documented  I have made any additions or clarifications directly to the above note. Agree with note above.  Patient with history of pulmonary embolism and not been compliant with Xarelto presented with sudden onset left hemiplegia and was given IV TNK.  Is obtained substantial improvement but is not back to his baseline.  Continue close neurological monitoring and strict blood pressure control as per post TNK protocol.  Physical Occupational Therapy consults. This patient is critically ill and at significant risk of neurological worsening, death and care requires constant monitoring of vital signs, hemodynamics,respiratory and cardiac monitoring, extensive review of multiple databases, frequent neurological assessment, discussion with family, other specialists and medical decision making of high complexity.I have made any additions or clarifications directly to the above note.This critical care time does not reflect procedure time, or teaching time or supervisory time of PA/NP/Med Resident etc but could involve care discussion time.  I spent 30 minutes of neurocritical care time  in the care of  this patient.     Antony Contras, MD Medical Director [facility] Pager: [phone] 06/12/2022 2:56 PM  To contact Stroke Continuity provider, please refer to [email or web address] After hours, contact General Neurology

## 2022-06-12 NOTE — Progress Notes (Signed)
SBP 80s/MAP 50s. Neurological status unchanged   Recs 1L NS bolus Can repeat another liter if needed  -- Amie Portland, MD Neurologist [facility] Pager: [phone]

## 2022-06-13 DIAGNOSIS — G43109 Migraine with aura, not intractable, without status migrainosus: Secondary | ICD-10-CM | POA: Diagnosis not present

## 2022-06-13 DIAGNOSIS — I10 Essential (primary) hypertension: Secondary | ICD-10-CM | POA: Insufficient documentation

## 2022-06-13 MED ORDER — SUMATRIPTAN SUCCINATE 6 MG/0.5ML ~~LOC~~ SOLN
6.0000 mg | Freq: Once | SUBCUTANEOUS | Status: AC
  Administered 2022-06-13: 6 mg via SUBCUTANEOUS
  Filled 2022-06-13: qty 0.5

## 2022-06-13 NOTE — Progress Notes (Addendum)
STROKE TEAM PROGRESS NOTE   INTERVAL HISTORY No family at the bedside  Neurological exam is Unchanged.  Events noted today- while working with speech therapy this morning he abruptly closed his eyes and was extending and flexing his head, not responding to voice or noxious stimuli lasting about 30 seconds ad then opened his eyes and began to slowly respond. He also is c/o headache and was given 6mg  SQ imitrex   Vitals:   06/13/22 0340 06/13/22 0752 06/13/22 1100 06/13/22 1516  BP: (!) 100/52 (!) 95/56 102/65 (!) 97/57  Pulse: (!) 56 (!) 59 60   Resp: 16 20  20   Temp: 98 F (36.7 C) 98.1 F (36.7 C) 98.5 F (36.9 C) 98.6 F (37 C)  TempSrc: Oral Oral Oral Oral  SpO2: 98% 100% 100% 100%  Weight:      Height:       CBC:  Recent Labs  Lab 06/11/22 1049 06/11/22 1054 06/12/22 0610  WBC 3.9*  --  3.3*  NEUTROABS 2.1  --   --   HGB 8.9* 9.2* 8.2*  HCT 28.6* 27.0* 27.3*  MCV 76.9*  --  78.4*  PLT 221  --  149    Basic Metabolic Panel:  Recent Labs  Lab 06/11/22 1049 06/11/22 1054 06/12/22 0610  NA 138 138 141  K 3.7 3.7 3.6  CL 106 103 107  CO2 23  --  22  GLUCOSE 98 97 133*  BUN 10 10 9   CREATININE 0.86 0.70 0.70  CALCIUM 8.1*  --  7.9*    Lipid Panel:  Recent Labs  Lab 06/12/22 0604  CHOL 79  TRIG 24  HDL 38*  CHOLHDL 2.1  VLDL 5  LDLCALC 36    HgbA1c:  Recent Labs  Lab 06/11/22 1224  HGBA1C 5.2    Urine Drug Screen: No results for input(s): "LABOPIA", "COCAINSCRNUR", "LABBENZ", "AMPHETMU", "THCU", "LABBARB" in the last 168 hours.  Alcohol Level  Recent Labs  Lab 06/11/22 1049  ETH <10     IMAGING past 24 hours No results found.  PHYSICAL EXAM Blind in left eye. Discongjuate gaze tracks. PERRl.   able to name and repeat sensation intact.  Temp:  [97.9 F (36.6 C)-98.6 F (37 C)] 98.6 F (37 C) (11/05 1516) Pulse Rate:  [55-75] 60 (11/05 1100) Resp:  [10-20] 20 (11/05 1516) BP: (84-113)/(48-86) 97/57 (11/05 1516) SpO2:  [96 %-100  %] 100 % (11/05 1516)  General - Well nourished, well developed, in no apparent distress. Cardiovascular - Regular rhythm and rate.  Mental Status -  Level of arousal and orientation to time, place, and person were intact. Language including expression, naming, repetition, comprehension was assessed and found intact. Attention span and concentration were normal. Recent and remote memory were intact. Fund of Knowledge was assessed and was intact.  Cranial Nerves II - XII - II - Visual field intact OU.blind in left eye at baseline III, IV, VI - Extraocular movements intact, tracks. Disconjugate gaze  V - Facial sensation intact bilaterally. VII - Facial movement intact bilaterally. VIII - Hearing & vestibular intact bilaterally. X - Palate elevates symmetrically. XI - Chin turning & shoulder shrug intact bilaterally. XII - Tongue protrusion intact.  Motor Strength - Left arm able to lift antigravity but drifts to bed. Left leg able to lift but drifts down, effort is poor and variable right arm and leg 5/5 Motor Tone - Muscle tone was assessed at the neck and appendages and was normal.  Sensory -  decreased on left   Coordination - The patient had normal movements in the hands and feet with no ataxia or dysmetria.  Tremor was absent.  Gait and Station - deferred.  ASSESSMENT/PLAN [name] is a 46 y.o. 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  At approximately 0845 this morning he developed a severe headache, right facial droop, and left sided weakness. He had previously been in his usual state of health and been able to go to the grocery store. He states that he has been on Xarelto since 2014, but has not taken it in a week due to running out. He has been compliant with his  seizure medications. He reports a 9/10 right temporal headache reporting nausea, photophobia, sonophobia, and osmophobia. Additionally he does have a history of seizures and did demonstrate upper extremity shaking with head shaking during the EMS trip lasting approximately 10 seconds.    Complicated Migraine vs Stroke s/p TNK  CT head x2 negative for stroke.  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  ASPECTS 10.    CTA head & neck Normal vasculature of the head and neck with no hemodynamically significant stenosis, occlusion, or dissection CT perfusion No infarct core or penumbra on CT perfusion  24 hr s/p TNK CT head negative for acute process MRI  unable to obtain due to PPM not compatible with MRI.  2D Echo EF 60-65% Trivial mitral valve regurgitation  LDL 36 HgbA1c 5.2 VTE prophylaxis - SCD's    Diet   Diet regular Room service appropriate? Yes with Assist; Fluid consistency: Thin   Xarelto (rivaroxaban) daily prior to admission, now on Xarelto (rivaroxaban) daily.  Therapy recommendations:  pending  Disposition:  pending  Hypertension Hypotension  Home meds:  none UnStable Hypotensive overnight requiring IVF bolus.  Long-term BP goal normotensive  Hyperlipidemia Home meds:  liptior 40mg , resumed in hospital LDL 36, goal < 70 Continue statin at discharge  P Atrial Fib  On Xarelto at home. Was out of medication off ~ 1 week  Will restart since no hemorrhage on 24 hr imaging    Seizure hx  Seizure precautions  On keppra, will continue  EEG This study is within normal limits. No seizures or epileptiform discharges were seen throughout the recording.   Other Stroke Risk Factors Former Cigarette smoker Hx stroke/TIA Coronary artery disease Migraines Obstructive sleep apnea, on CPAP at home Congestive heart failure  Other Active Problems Anemia Hgb 8.2  Hospital day # 2  Beulah Gandy DNP, ACNPC-AG   I have  personally obtained history,examined this patient, reviewed notes, independently viewed imaging studies, participated in medical decision making and plan of care.ROS completed by me personally and pertinent positives fully documented  I have made any additions or clarifications directly to the above note. Agree with note above.  Patient lives with some housemates and therapist feel is not ready to go home and are now recommending skilled nursing facility.  Patient also complaining of headache we will treat with Imitrex.  Greater than 50% time during this 35-minute visit was spent in counseling and coordination of care and discussion patient and care team and answering questions.  Antony Contras, MD Medical Director [facility] Pager: [phone] 06/13/2022 4:53 PM  To contact Stroke Continuity provider, please refer to [email or web address] After hours, contact General Neurology

## 2022-06-13 NOTE — Discharge Summary (Signed)
Stroke Discharge Summary  Patient ID: Richard Benson   MRN: [id number]      DOB: 1976-01-07  Date of Admission: 06/11/2022 Date of Discharge: 06/15/2022  Attending Physician:  Stroke, Md, MD, Stroke MD Consultant(s):    None  Patient's PCP:  Pcp, No  DISCHARGE DIAGNOSIS: Patient discharged AGAINST MEDICAL ADVICE Principal Problem: Strokelike episode s/p IV TNK   Complicated migraine Active Problems:   Hyperlipidemia   Anxiety state   SLEEP APNEA, OBSTRUCTIVE, SEVERE   Paroxysmal atrial fibrillation (HCC)   Seizures (HCC)   Pacemaker   Essential hypertension Conversion reaction   Allergies as of 06/13/2022   No Known Allergies    No current facility-administered medications on file prior to encounter.   Current Outpatient Medications on File Prior to Encounter  Medication Sig Dispense Refill   acetaminophen (TYLENOL) 500 MG tablet Take 500-1,000 mg by mouth every 6 (six) hours as needed (for pain).     atorvastatin (LIPITOR) 40 MG tablet Take 1 tablet (40 mg total) by mouth daily at 6 PM. (Patient taking differently: Take 40 mg by mouth at bedtime.) 30 tablet 6   baclofen (LIORESAL) 10 MG tablet Take 10 mg by mouth at bedtime as needed for muscle spasms.     busPIRone (BUSPAR) 15 MG tablet Take 15 mg by mouth in the morning and at bedtime.     Cholecalciferol (VITAMIN D3) 5000 units CAPS Take 5,000 Units by mouth daily.     Cyanocobalamin (VITAMIN B-12 PO) Take 1 tablet by mouth daily.     gabapentin (NEURONTIN) 300 MG capsule Take 300 mg by mouth 3 (three) times daily.     levETIRAcetam (KEPPRA) 750 MG tablet Take 1 tablet (750 mg total) by mouth 2 (two) times daily. (Patient taking differently: Take 750 mg by mouth at bedtime.) 60 tablet 6   mirtazapine (REMERON) 30 MG tablet Take 30 mg by mouth at bedtime.     Multiple Vitamin (MULTIVITAMIN WITH MINERALS) TABS tablet Take 1 tablet by mouth daily.     nitroGLYCERIN (NITROSTAT) 0.4 MG SL tablet Place 0.4 mg under the  tongue every 5 (five) minutes as needed for chest pain.      ondansetron (ZOFRAN-ODT) 4 MG disintegrating tablet Take 4 mg by mouth 3 (three) times daily before meals.     oxyCODONE-acetaminophen (PERCOCET) 10-325 MG tablet Take 1 tablet by mouth every 6 (six) hours.     rivaroxaban (XARELTO) 20 MG TABS tablet Take 1 tablet (20 mg total) by mouth daily with supper. 30 tablet    aspirin EC 81 MG tablet Take 1 tablet (81 mg total) by mouth daily. (Patient not taking: Reported on 06/12/2022) 30 tablet 6   busPIRone (BUSPAR) 10 MG tablet Take 10 mg by mouth 2 (two) times daily. (Patient not taking: Reported on 06/12/2022)     traZODone (DESYREL) 50 MG tablet Take 50-100 mg by mouth at bedtime as needed for sleep. (Patient not taking: Reported on 06/12/2022)            LABORATORY STUDIES CBC    Component Value Date/Time   WBC 3.3 (L) 06/12/2022 0610   RBC 3.48 (L) 06/12/2022 0610   HGB 8.2 (L) 06/12/2022 0610   HCT 27.3 (L) 06/12/2022 0610   PLT 187 06/12/2022 0610   MCV 78.4 (L) 06/12/2022 0610   MCH 23.6 (L) 06/12/2022 0610   MCHC 30.0 06/12/2022 0610   RDW 15.4 06/12/2022 0610   LYMPHSABS 1.3 06/11/2022 1049  MONOABS 0.4 06/11/2022 1049   EOSABS 0.1 06/11/2022 1049   BASOSABS 0.1 06/11/2022 1049   CMP    Component Value Date/Time   NA 141 06/12/2022 0610   K 3.6 06/12/2022 0610   CL 107 06/12/2022 0610   CO2 22 06/12/2022 0610   GLUCOSE 133 (H) 06/12/2022 0610   BUN 9 06/12/2022 0610   CREATININE 0.70 06/12/2022 0610   CALCIUM 7.9 (L) 06/12/2022 0610   PROT 5.9 (L) 06/11/2022 1049   ALBUMIN 3.2 (L) 06/11/2022 1049   AST 19 06/11/2022 1049   ALT 12 06/11/2022 1049   ALKPHOS 53 06/11/2022 1049   BILITOT 0.4 06/11/2022 1049   GFRNONAA >60 06/12/2022 0610   GFRAA >60 02/14/2018 0321   COAGS Lab Results  Component Value Date   INR 1.5 (H) 06/11/2022   INR 1.0 04/23/2021   INR 1.13 02/11/2018   Lipid Panel    Component Value Date/Time   CHOL 79 06/12/2022 0604    TRIG 24 06/12/2022 0604   HDL 38 (L) 06/12/2022 0604   CHOLHDL 2.1 06/12/2022 0604   VLDL 5 06/12/2022 0604   LDLCALC 36 06/12/2022 0604   HgbA1C  Lab Results  Component Value Date   HGBA1C 5.2 06/11/2022   Urinalysis    Component Value Date/Time   COLORURINE YELLOW 07/07/2011 1708   APPEARANCEUR CLEAR 07/07/2011 1708   LABSPEC 1.028 07/07/2011 1708   PHURINE 6.5 07/07/2011 1708   GLUCOSEU NEGATIVE 07/07/2011 1708   GLUCOSEU NEGATIVE 08/01/2007 1617   HGBUR NEGATIVE 07/07/2011 1708   BILIRUBINUR NEGATIVE 07/07/2011 1708   KETONESUR NEGATIVE 07/07/2011 1708   PROTEINUR NEGATIVE 07/07/2011 1708   UROBILINOGEN 1.0 07/07/2011 1708   NITRITE NEGATIVE 07/07/2011 1708   LEUKOCYTESUR NEGATIVE 07/07/2011 1708   Urine Drug Screen     Component Value Date/Time   LABOPIA NONE DETECTED 11/04/2017 1523   COCAINSCRNUR NONE DETECTED 11/04/2017 1523   LABBENZ NONE DETECTED 11/04/2017 1523   AMPHETMU NONE DETECTED 11/04/2017 1523   THCU NONE DETECTED 11/04/2017 1523   LABBARB NONE DETECTED 11/04/2017 1523    Alcohol Level    Component Value Date/Time   ETH <10 06/11/2022 1049     SIGNIFICANT DIAGNOSTIC STUDIES CT HEAD WO CONTRAST ( )  Result Date: 06/12/2022 CLINICAL DATA:  Stroke follow-up.  T ink a EXAM: CT HEAD WITHOUT CONTRAST TECHNIQUE: Contiguous axial images were obtained from the base of the skull through the vertex without intravenous contrast. RADIATION DOSE REDUCTION: This exam was performed according to the departmental dose-optimization program which includes automated exposure control, adjustment of the mA and/or kV according to patient size and/or use of iterative reconstruction technique. COMPARISON:  Yesterday FINDINGS: Brain: No evidence of acute infarction, hemorrhage, hydrocephalus, extra-axial collection or mass lesion/mass effect. Vascular: No hyperdense vessel or unexpected calcification. Skull: Normal. Negative for fracture or focal lesion. Sinuses/Orbits: No  acute finding.  Postoperative left globe. IMPRESSION: Negative for hemorrhage or visible infarct. Electronically Signed   By: Tiburcio Pea M.D.   On: 06/12/2022 11:19   EEG adult  Result Date: 06/12/2022 Charlsie Quest, MD     06/12/2022  7:20 AM Patient Name: Richard Benson MRN: [id number] Epilepsy Attending: Charlsie Quest Referring Physician/Provider: Elmer Picker, NP Date: 06/11/2022 Duration: 21.44 mins Patient history: 46yo M with headache, upper extremity shaking with head shaking. EEG to evaluate for seizure Level of alertness: Awake, asleep AEDs during EEG study: LEV, GBP Technical aspects: This EEG study was done with scalp electrodes positioned according to the  10-20 International system of electrode placement. Electrical activity was reviewed with band pass filter of 1-70Hz , sensitivity of 7 uV/mm, display speed of 7430mm/sec with a 60Hz  notched filter applied as appropriate. EEG data were recorded continuously and digitally stored.  Video monitoring was available and reviewed as appropriate. Description: The posterior dominant rhythm consists of 9 Hz activity of moderate voltage (25-35 uV) seen predominantly in posterior head regions, symmetric and reactive to eye opening and eye closing. Sleep was characterized by vertex waves, sleep spindles (12 to 14 Hz), maximal frontocentral region. Hyperventilation and photic stimulation were not performed.   IMPRESSION: This study is within normal limits. No seizures or epileptiform discharges were seen throughout the recording. A normal interictal EEG does not exclude the diagnosis of epilepsy. Priyanka Annabelle Harman Yadav   CT HEAD WO CONTRAST (5MM)  Result Date: 06/11/2022 CLINICAL DATA:  Follow-up examination for stroke. Continued headache status post TNK. EXAM: CT HEAD WITHOUT CONTRAST TECHNIQUE: Contiguous axial images were obtained from the base of the skull through the vertex without intravenous contrast. RADIATION DOSE REDUCTION: This exam was  performed according to the departmental dose-optimization program which includes automated exposure control, adjustment of the mA and/or kV according to patient size and/or use of iterative reconstruction technique. COMPARISON:  Prior CT from earlier the same day. FINDINGS: Brain: No acute intracranial hemorrhage. No definite or convincing large vessel territory infarct. No mass lesion, midline shift or mass effect. No hydrocephalus or extra-axial fluid collection. Vascular: No abnormal hyperdense vessel. Skull: Scalp soft tissues and calvarium demonstrate no new finding. Sinuses/Orbits: Globes orbital soft tissues demonstrate no acute finding. Postoperative changes noted about the left lobe. Mild pneumatized secretions noted within the right sphenoid sinus. No significant mastoid effusion. Other: None. IMPRESSION: Stable head CT. No acute intracranial hemorrhage or other abnormality. Electronically Signed   By: Rise MuBenjamin  McClintock M.D.   On: 06/11/2022 23:09   ECHOCARDIOGRAM COMPLETE  Result Date: 06/11/2022    ECHOCARDIOGRAM REPORT   Patient Name:   Richard Benson Date of Exam: 06/11/2022 Medical Rec #:  [id number]           Height:       71.0 in Accession #:    [id number]          Weight:       178.6 lb Date of Birth:  03/14/1976           BSA:          2.009 m Patient Age:    46 years            BP:           103/76 mmHg Patient Gender: M                   HR:           61 bpm. Exam Location:  Inpatient Procedure: 2D Echo, Cardiac Doppler and Color Doppler Indications:    Stroke  History:        Patient has prior history of Echocardiogram examinations, most                 recent 12/15/2008. Abnormal ECG and Pacemaker, Stroke,                 Signs/Symptoms:Syncope; Risk Factors:Dyslipidemia.  Sonographer:    Sheralyn Boatmanina West RDCS Referring Phys: [id number] [facility]  Sonographer Comments: Technically difficult study due to poor echo windows. IMPRESSIONS  1. Left ventricular ejection fraction, by estimation, is  60 to 65%. The left ventricle has normal function. The left ventricle has no regional wall motion abnormalities. Left ventricular diastolic parameters were normal.  2. Right ventricular systolic function is normal. The right ventricular size is normal. There is normal pulmonary artery systolic pressure.  3. The mitral valve is normal in structure. Trivial mitral valve regurgitation. No evidence of mitral stenosis.  4. The aortic valve is tricuspid. Aortic valve regurgitation is not visualized. No aortic stenosis is present.  5. The inferior vena cava is dilated in size with <50% respiratory variability, suggesting right atrial pressure of 15 mmHg. Comparison(s): No prior Echocardiogram. FINDINGS  Left Ventricle: Left ventricular ejection fraction, by estimation, is 60 to 65%. The left ventricle has normal function. The left ventricle has no regional wall motion abnormalities. The left ventricular internal cavity size was normal in size. There is  no left ventricular hypertrophy. Left ventricular diastolic parameters were normal. Right Ventricle: The right ventricular size is normal. Right ventricular systolic function is normal. There is normal pulmonary artery systolic pressure. The tricuspid regurgitant velocity is 2.06 m/s, and with an assumed right atrial pressure of 15 mmHg, the estimated right ventricular systolic pressure is 32.0 mmHg. Left Atrium: Left atrial size was normal in size. Right Atrium: Right atrial size was normal in size. Pericardium: There is no evidence of pericardial effusion. Mitral Valve: The mitral valve is normal in structure. Trivial mitral valve regurgitation. No evidence of mitral valve stenosis. Tricuspid Valve: The tricuspid valve is normal in structure. Tricuspid valve regurgitation is mild . No evidence of tricuspid stenosis. Aortic Valve: The aortic valve is tricuspid. Aortic valve regurgitation is not visualized. No aortic stenosis is present. Pulmonic Valve: The pulmonic valve  was not well visualized. Pulmonic valve regurgitation is not visualized. No evidence of pulmonic stenosis. Aorta: The aortic root is normal in size and structure. Venous: The inferior vena cava is dilated in size with less than 50% respiratory variability, suggesting right atrial pressure of 15 mmHg. IAS/Shunts: No atrial level shunt detected by color flow Doppler. Additional Comments: A device lead is visualized.  LEFT VENTRICLE PLAX 2D LVIDd:         4.60 cm     Diastology LVIDs:         3.20 cm     LV e' medial:    17.40 cm/s LV PW:         0.80 cm     LV E/e' medial:  5.4 LV IVS:        0.90 cm     LV e' lateral:   18.20 cm/s LVOT diam:     2.60 cm     LV E/e' lateral: 5.2 LV SV:         108 LV SV Index:   54 LVOT Area:     5.31 cm  LV Volumes (MOD) LV vol d, MOD A2C: 77.7 ml LV vol d, MOD A4C: 84.2 ml LV vol s, MOD A2C: 25.5 ml LV vol s, MOD A4C: 31.1 ml LV SV MOD A2C:     52.2 ml LV SV MOD A4C:     84.2 ml LV SV MOD BP:      53.2 ml RIGHT VENTRICLE             IVC RV S prime:     14.00 cm/s  IVC diam: 2.60 cm TAPSE (M-mode): 3.0 cm LEFT ATRIUM             Index  RIGHT ATRIUM           Index LA diam:        2.60 cm 1.29 cm/m   RA Area:     15.10 cm LA Vol (A2C):   45.4 ml 22.59 ml/m  RA Volume:   43.60 ml  21.70 ml/m LA Vol (A4C):   32.4 ml 16.12 ml/m LA Biplane Vol: 39.4 ml 19.61 ml/m  AORTIC VALVE LVOT Vmax:   106.00 cm/s LVOT Vmean:  68.400 cm/s LVOT VTI:    0.204 m  AORTA Ao Root diam: 3.30 cm Ao Asc diam:  3.70 cm MITRAL VALVE               TRICUSPID VALVE MV Area (PHT): 3.66 cm    TR Peak grad:   17.0 mmHg MV Decel Time: 207 msec    TR Vmax:        206.00 cm/s MV E velocity: 94.12 cm/s MV A velocity: 82.02 cm/s  SHUNTS MV E/A ratio:  1.15        Systemic VTI:  0.20 m                            Systemic Diam: 2.60 cm Olga Millers MD Electronically signed by Olga Millers MD Signature Date/Time: 06/11/2022/2:44:34 PM    Final    CT HEAD CODE STROKE WO CONTRAST  Result Date:  06/11/2022 CLINICAL DATA:  Left-sided weakness, headache. EXAM: CT ANGIOGRAPHY HEAD AND NECK CT PERFUSION BRAIN TECHNIQUE: Multidetector CT imaging of the head and neck was performed using the standard protocol during bolus administration of intravenous contrast. Multiplanar CT image reconstructions and MIPs were obtained to evaluate the vascular anatomy. Carotid stenosis measurements (when applicable) are obtained utilizing NASCET criteria, using the distal internal carotid diameter as the denominator. Multiphase CT imaging of the brain was performed following IV bolus contrast injection. Subsequent parametric perfusion maps were calculated using RAPID software. RADIATION DOSE REDUCTION: This exam was performed according to the departmental dose-optimization program which includes automated exposure control, adjustment of the mA and/or kV according to patient size and/or use of iterative reconstruction technique. CONTRAST:  60 Omni 350 COMPARISON:  CT/CTA head and neck 04/23/2021 FINDINGS: CT HEAD FINDINGS Brain: There is no acute intracranial hemorrhage, extra-axial fluid collection, or acute infarct. Parenchymal volume is normal. The ventricles are normal in size. Gray-white differentiation is preserved There is no mass lesion. There is no mass effect or midline shift. Vascular: See below. Skull: Normal. Negative for fracture or focal lesion. Sinuses/Orbits: The paranasal sinuses are clear. Left lens implant and scleral buckle are noted. The globes and orbits are otherwise unremarkable. Other: None. ASPECTS (Alberta Stroke Program Early CT Score) - Ganglionic level infarction (caudate, lentiform nuclei, internal capsule, insula, M1-M3 cortex): 7 - Supraganglionic infarction (M4-M6 cortex): 3 Total score (0-10 with 10 being normal): 10 Review of the MIP images confirms the above findings CTA NECK FINDINGS Aortic arch: The imaged aortic arch is normal. The origins of the major branch vessels are patent. The  subclavian arteries are patent to the level imaged. Right carotid system: The right common, internal, and external carotid arteries are patent, without hemodynamically significant stenosis or occlusion. There is no dissection or aneurysm. Left carotid system: The left common, internal, and external carotid arteries are patent, without hemodynamically significant stenosis or occlusion. There is no dissection or aneurysm. Vertebral arteries: The vertebral arteries are patent, without hemodynamically significant stenosis or occlusion. There is  no dissection or aneurysm. Skeleton: There is no acute osseous abnormality or suspicious osseous lesion. 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 There is no visible canal hematoma. Other neck: The soft tissues of the neck are unremarkable. Upper chest: The imaged lung apices are clear. A left chest wall cardiac device and associated leads are partially imaged. Review of the MIP images confirms the above findings CTA HEAD FINDINGS Anterior circulation: The intracranial ICAs are patent. The bilateral MCAs are patent. The bilateral ACAs are patent. The anterior communicating artery is normal. There is no aneurysm or AVM. Posterior circulation: The bilateral V4 segments are patent. The basilar artery is patent. The major cerebellar arteries are patent. The bilateral PCAs are patent. There is a fetal origin of the right PCA. A left posterior communicating artery is not identified. There is no aneurysm or AVM. Venous sinuses: Patent. Anatomic variants: As above. Review of the MIP images confirms the above findings CT Brain Perfusion Findings: ASPECTS: 10 CBF (<30%) Volume: 55mL Perfusion (Tmax>6.0s) volume: 32mL Mismatch Volume: 19mL Infarction Location:N/a IMPRESSION: 1. No acute intracranial pathology. 2. Normal vasculature of the head and neck with no hemodynamically significant stenosis, occlusion, or dissection. 3. No infarct core or penumbra on CT perfusion. These  results were called by telephone at the time of interpretation on 06/11/2022 at 10:58 am to provider Dr Otelia Limes, who verbally acknowledged these results. Electronically Signed   By: Lesia Hausen M.D.   On: 06/11/2022 11:19   CT ANGIO HEAD NECK W WO CM W PERF (CODE STROKE)  Result Date: 06/11/2022 CLINICAL DATA:  Left-sided weakness, headache. EXAM: CT ANGIOGRAPHY HEAD AND NECK CT PERFUSION BRAIN TECHNIQUE: Multidetector CT imaging of the head and neck was performed using the standard protocol during bolus administration of intravenous contrast. Multiplanar CT image reconstructions and MIPs were obtained to evaluate the vascular anatomy. Carotid stenosis measurements (when applicable) are obtained utilizing NASCET criteria, using the distal internal carotid diameter as the denominator. Multiphase CT imaging of the brain was performed following IV bolus contrast injection. Subsequent parametric perfusion maps were calculated using RAPID software. RADIATION DOSE REDUCTION: This exam was performed according to the departmental dose-optimization program which includes automated exposure control, adjustment of the mA and/or kV according to patient size and/or use of iterative reconstruction technique. CONTRAST:  60 Omni 350 COMPARISON:  CT/CTA head and neck 04/23/2021 FINDINGS: CT HEAD FINDINGS Brain: There is no acute intracranial hemorrhage, extra-axial fluid collection, or acute infarct. Parenchymal volume is normal. The ventricles are normal in size. Gray-white differentiation is preserved There is no mass lesion. There is no mass effect or midline shift. Vascular: See below. Skull: Normal. Negative for fracture or focal lesion. Sinuses/Orbits: The paranasal sinuses are clear. Left lens implant and scleral buckle are noted. The globes and orbits are otherwise unremarkable. Other: None. ASPECTS (Alberta Stroke Program Early CT Score) - Ganglionic level infarction (caudate, lentiform nuclei, internal capsule, insula,  M1-M3 cortex): 7 - Supraganglionic infarction (M4-M6 cortex): 3 Total score (0-10 with 10 being normal): 10 Review of the MIP images confirms the above findings CTA NECK FINDINGS Aortic arch: The imaged aortic arch is normal. The origins of the major branch vessels are patent. The subclavian arteries are patent to the level imaged. Right carotid system: The right common, internal, and external carotid arteries are patent, without hemodynamically significant stenosis or occlusion. There is no dissection or aneurysm. Left carotid system: The left common, internal, and external carotid arteries are patent, without hemodynamically significant stenosis  or occlusion. There is no dissection or aneurysm. Vertebral arteries: The vertebral arteries are patent, without hemodynamically significant stenosis or occlusion. There is no dissection or aneurysm. Skeleton: There is no acute osseous abnormality or suspicious osseous lesion. 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 There is no visible canal hematoma. Other neck: The soft tissues of the neck are unremarkable. Upper chest: The imaged lung apices are clear. A left chest wall cardiac device and associated leads are partially imaged. Review of the MIP images confirms the above findings CTA HEAD FINDINGS Anterior circulation: The intracranial ICAs are patent. The bilateral MCAs are patent. The bilateral ACAs are patent. The anterior communicating artery is normal. There is no aneurysm or AVM. Posterior circulation: The bilateral V4 segments are patent. The basilar artery is patent. The major cerebellar arteries are patent. The bilateral PCAs are patent. There is a fetal origin of the right PCA. A left posterior communicating artery is not identified. There is no aneurysm or AVM. Venous sinuses: Patent. Anatomic variants: As above. Review of the MIP images confirms the above findings CT Brain Perfusion Findings: ASPECTS: 10 CBF (<30%) Volume: 0mL Perfusion  (Tmax>6.0s) volume: 0mL Mismatch Volume: 0mL Infarction Location:N/a IMPRESSION: 1. No acute intracranial pathology. 2. Normal vasculature of the head and neck with no hemodynamically significant stenosis, occlusion, or dissection. 3. No infarct core or penumbra on CT perfusion. These results were called by telephone at the time of interpretation on 06/11/2022 at 10:58 am to provider Dr Otelia Limes, who verbally acknowledged these results. Electronically Signed   By: Lesia Hausen M.D.   On: 06/11/2022 11:19      HISTORY OF PRESENT ILLNESS  [name] is a 46 y.o. 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  At approximately 0845 this morning he developed a severe headache, right facial droop, and left sided weakness. He had previously been in his usual state of health and been able to go to the grocery store. He states that he has been on Xarelto since 2014, but has not taken it in a week due to running out. He has been compliant with his seizure medications. He reports a 9/10 right temporal headache reporting nausea, photophobia, sonophobia, and osmophobia. Additionally he does have a history of seizures and did demonstrate upper extremity shaking with head shaking during the EMS trip lasting approximately 10 seconds.    Patient was seen by physical Occupational Therapy and the initial recommendation is for discharge home but when patient ambulated with therapy he developed acute neurological change requiring them to change the recommendation to inpatient rehab but he was deemed not to be a candidate and felt skilled nursing facility was not appropriate but patient refused to go to skilled nursing facility.  He also had variable nonorganic symptoms in the form of sudden onset of sleepiness and inability to  respond once which was transient and resolved and on the other occasion he had sudden onset inability to walk while ambulating with therapist with his left foot stuck firmly on the ground and not moving with a lot of coaching and positive reinforcement improved over time.  CT scan of the head repeated showed no acute abnormality. HOSPITAL COURSE Complicated Migraine vs Stroke s/p TNK  CT head x2 negative for stroke.  Unable to obtain  MRI due to incompatible pacemaker Code Stroke CT head No acute abnormality.  ASPECTS 10.    CTA head & neck Normal vasculature of the head and neck with no hemodynamically significant stenosis, occlusion, or dissection CT perfusion No infarct core or penumbra on CT perfusion  24

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
## 2022-06-13 NOTE — Progress Notes (Addendum)
Physical Therapy Treatment Patient Details Name: Richard Benson MRN: [id number] DOB: 04-17-1976 Today's Date: 06/13/2022   History of Present Illness 46 y.o. male presents to [facility] hospital on 06/11/2022 with HA, R facial droop and L weakness. PT also with a history of seizures and demonstrated RUE shaking during EMS trip. CT and CTA negative, pt received tNK. PMH includes blind left eye, CHF, CVA, afib, seizures.    PT Comments    Seen for follow-up after PT evaluation conducted yesterday. Slight improvement in functional mobility however still unsteady, requiring RW to support himself while ambulating short distances only. Still with inconsistencies in LLE strength compared to functional ability to weight bear while ambulating. Has 2 roommates at home but no assistance available. Would consider SNF for further rehabilitation until he reaches a more independent level for return to home; patient was not open to SNF option. If not agreeable consider HHPT with further resources such as an aide. Discussed with OT. RN updated. Patient will continue to benefit from skilled physical therapy services to further improve independence with functional mobility.    Recommendations for follow up therapy are one component of a multi-disciplinary discharge planning process, led by the attending physician.  Recommendations may be updated based on patient status, additional functional criteria and insurance authorization.  Follow Up Recommendations  Skilled nursing-short term rehab (<3 hours/day) Can patient physically be transported by private vehicle: Yes   Assistance Recommended at Discharge Intermittent Supervision/Assistance  Patient can return home with the following A little help with walking and/or transfers;A little help with bathing/dressing/bathroom;Assistance with cooking/housework;Assist for transportation;Help with stairs or ramp for entrance   Equipment Recommendations  Rolling walker (2  wheels);BSC/3in1    Recommendations for Other Services       Precautions / Restrictions Precautions Precautions: Fall Restrictions Weight Bearing Restrictions: No     Mobility  Bed Mobility Overal bed mobility: Modified Independent             General bed mobility comments: Extra time, lifts LLE in/out of bed without external assist.    Transfers Overall transfer level: Needs assistance Equipment used: Rolling walker (2 wheels) Transfers: Sit to/from Stand, Bed to chair/wheelchair/BSC Sit to Stand: Min guard           General transfer comment: Min guard for sit<>stand showing some posterior instability using back of knees against bed. Practiced several times with use of RW for support upon standing.    Ambulation/Gait Ambulation/Gait assistance: Min guard Gait Distance (Feet): 40 Feet Assistive device: Rolling walker (2 wheels) Gait Pattern/deviations: Step-through pattern, Decreased dorsiflexion - left Gait velocity: reduced Gait velocity interpretation: <1.31 ft/sec, indicative of household ambulator   General Gait Details: Using RW for support, min guard for safety. LLE abnormally extended as if stiff. Able to dorsiflex lt ankle and extend toes to clear the ground but dragging plantar surfaces of foot, unable to flex knee and hip with instructions for improved gait symmetry. No buckling or overt LOB noted during this bout. Distance limited. Pt shaking his head laterally for approx 10 seconds once returned to room but without loss of motor tone or balance.   Stairs             Wheelchair Mobility    Modified Rankin (Stroke Patients Only) Modified Rankin (Stroke Patients Only) Pre-Morbid Rankin Score: No symptoms Modified Rankin: Moderately severe disability     Balance Overall balance assessment: Needs assistance Sitting-balance support: No upper extremity supported, Feet supported Sitting balance-Leahy Scale: Good  Standing balance support:  Single extremity supported, Reliant on assistive device for balance Standing balance-Leahy Scale: Poor                              Cognition Arousal/Alertness: Awake/alert Behavior During Therapy: WFL for tasks assessed/performed, Flat affect Overall Cognitive Status: Within Functional Limits for tasks assessed                                          Exercises      General Comments        Pertinent Vitals/Pain Pain Assessment Pain Assessment: Faces Faces Pain Scale: Hurts even more Pain Location: head Pain Descriptors / Indicators: Aching Pain Intervention(s): Monitored during session, Repositioned    Home Living     Available Help at Discharge: Family;Available PRN/intermittently Type of Home: House                  Prior Function            PT Goals (current goals can now be found in the care plan section) Acute Rehab PT Goals Patient Stated Goal: to regain strength, return to independence PT Goal Formulation: With patient Time For Goal Achievement: 06/26/22 Potential to Achieve Goals: Good Progress towards PT goals: Progressing toward goals    Frequency    Min 4X/week      PT Plan Discharge plan needs to be updated    Co-evaluation              AM-PAC PT "6 Clicks" Mobility   Outcome Measure  Help needed turning from your back to your side while in a flat bed without using bedrails?: None Help needed moving from lying on your back to sitting on the side of a flat bed without using bedrails?: None Help needed moving to and from a bed to a chair (including a wheelchair)?: A Little Help needed standing up from a chair using your arms (e.g., wheelchair or bedside chair)?: A Little Help needed to walk in hospital room?: A Little Help needed climbing 3-5 steps with a railing? : A Lot 6 Click Score: 19    End of Session Equipment Utilized During Treatment: Gait belt Activity Tolerance: Patient tolerated  treatment well Patient left: with call bell/phone within reach;in bed;with bed alarm set Nurse Communication: Mobility status PT Visit Diagnosis: Other abnormalities of gait and mobility (R26.89);Other symptoms and signs involving the nervous system (R29.898);Muscle weakness (generalized) (M62.81);Hemiplegia and hemiparesis Hemiplegia - Right/Left: Left Hemiplegia - dominant/non-dominant: Non-dominant Hemiplegia - caused by: Unspecified     Time: 7253-6644 PT Time Calculation (min) (ACUTE ONLY): 26 min  Charges:  $Gait Training: 8-22 mins $Therapeutic Activity: 8-22 mins                     Kathlyn Sacramento, PT, DPT Physical Therapist Acute Rehabilitation Services [facility] Outpatient Rehabilitation Services [facility] 06/13/2022, 1:09 PM

## 2022-06-13 NOTE — Progress Notes (Signed)
Occupational Therapy Treatment Patient Details Name: Richard Benson MRN: [id number] DOB: 1976-01-01 Today's Date: 06/13/2022   History of present illness 46 y.o. male presents to [facility] hospital on 06/11/2022 with HA, R facial droop and L weakness. PT also with a history of seizures and demonstrated RUE shaking during EMS trip. CT and CTA negative, pt received tNK. PMH includes blind left eye, CHF, CVA, afib, seizures.   OT comments  Pt progressing towards acute OT goals. Able to complete functional transfers and walked into the hall a bit with min guard assist. Extra time and effort to complete tasks. Slides L foot to advance. Did well with walker. He is concerned about going home but declines ST SNF. He lives on the second floor of a house he shares with others "6 bedrooms." Discussed arranging to live on main level at d/c until he is better able to complete full flight of stairs. Gilford Rile has been recommended by PT.    Recommendations for follow up therapy are one component of a multi-disciplinary discharge planning process, led by the attending physician.  Recommendations may be updated based on patient status, additional functional criteria and insurance authorization.    Follow Up Recommendations  Home health OT    Assistance Recommended at Discharge Frequent or constant Supervision/Assistance  Patient can return home with the following      Equipment Recommendations  BSC/3in1    Recommendations for Other Services      Precautions / Restrictions Precautions Precautions: Fall Restrictions Weight Bearing Restrictions: No       Mobility Bed Mobility Overal bed mobility: Modified Independent Bed Mobility: Supine to Sit, Sit to Supine     Supine to sit: Supervision Sit to supine: Supervision   General bed mobility comments: Extra time, lifts LLE in/out of bed without external assist.    Transfers Overall transfer level: Needs assistance Equipment used: Rolling walker (2  wheels) Transfers: Sit to/from Stand Sit to Stand: Min guard           General transfer comment: to/from EOB. effortful but no physical assist needed.     Balance Overall balance assessment: Needs assistance Sitting-balance support: No upper extremity supported, Feet supported Sitting balance-Leahy Scale: Good     Standing balance support: Single extremity supported, Reliant on assistive device for balance Standing balance-Leahy Scale: Poor                             ADL either performed or assessed with clinical judgement   ADL Overall ADL's : Needs assistance/impaired                         Toilet Transfer: Min guard;Ambulation           Functional mobility during ADLs: Min guard;Rolling walker (2 wheels) General ADL Comments: slow gait speed, slides L foot to advance. effortful gait. utilized walker    Extremity/Trunk Assessment Upper Extremity Assessment Upper Extremity Assessment: LUE deficits/detail LUE Deficits / Details: inconsistent performance. able to hold rw and advance using both hands. pt reports "tinglining" from shoulder to wrist LUE Sensation: decreased light touch LUE Coordination: decreased fine motor;decreased gross motor   Lower Extremity Assessment Lower Extremity Assessment: Defer to PT evaluation        Vision       Perception     Praxis      Cognition Arousal/Alertness: Awake/alert Behavior During Therapy: WFL for tasks assessed/performed, Flat  affect Overall Cognitive Status: Within Functional Limits for tasks assessed                                          Exercises      Shoulder Instructions       General Comments      Pertinent Vitals/ Pain       Pain Assessment Pain Assessment: Faces Faces Pain Scale: Hurts even more Pain Location: head and back, LLE "tingling" Pain Descriptors / Indicators: Aching, Tingling Pain Intervention(s): Monitored during session, Patient  requesting pain meds-RN notified, RN gave pain meds during session, Limited activity within patient's tolerance  Home Living                                          Prior Functioning/Environment              Frequency  Min 2X/week        Progress Toward Goals  OT Goals(current goals can now be found in the care plan section)  Progress towards OT goals: Progressing toward goals  Acute Rehab OT Goals Patient Stated Goal: Get stronger OT Goal Formulation: With patient Time For Goal Achievement: 06/26/22 Potential to Achieve Goals: Good ADL Goals Pt Will Perform Grooming: with min guard assist;standing Pt Will Perform Lower Body Dressing: with min guard assist;sit to/from stand Pt Will Transfer to Toilet: with min guard assist;bedside commode;ambulating Pt Will Perform Toileting - Clothing Manipulation and hygiene: with min guard assist;sit to/from stand;sitting/lateral leans  Plan Discharge plan remains appropriate    Co-evaluation                 AM-PAC OT "6 Clicks" Daily Activity     Outcome Measure   Help from another person eating meals?: None Help from another person taking care of personal grooming?: A Little Help from another person toileting, which includes using toliet, bedpan, or urinal?: A Little Help from another person bathing (including washing, rinsing, drying)?: A Little Help from another person to put on and taking off regular upper body clothing?: A Little Help from another person to put on and taking off regular lower body clothing?: A Little 6 Click Score: 19    End of Session Equipment Utilized During Treatment: Rolling walker (2 wheels)  OT Visit Diagnosis: Unsteadiness on feet (R26.81);Other abnormalities of gait and mobility (R26.89);Muscle weakness (generalized) (M62.81);Pain   Activity Tolerance Patient tolerated treatment well   Patient Left in bed;with call bell/phone within reach;with bed alarm set;with  family/visitor present   Nurse Communication          Time: 4401-0272 OT Time Calculation (min): 15 min  Charges: OT General Charges $OT Visit: 1 Visit OT Treatments $Self Care/Home Management : 8-22 mins  Tyrone Schimke, OT Acute Rehabilitation Services Office: [place] 06/13/2022, 3:15 PM

## 2022-06-14 ENCOUNTER — Inpatient Hospital Stay (HOSPITAL_COMMUNITY): Payer: Medicaid Other

## 2022-06-14 DIAGNOSIS — G43109 Migraine with aura, not intractable, without status migrainosus: Principal | ICD-10-CM

## 2022-06-14 DIAGNOSIS — E43 Unspecified severe protein-calorie malnutrition: Secondary | ICD-10-CM | POA: Insufficient documentation

## 2022-06-14 LAB — GLUCOSE, CAPILLARY: Glucose-Capillary: 82 mg/dL (ref 70–99)

## 2022-06-14 MED ORDER — ENSURE ENLIVE PO LIQD
237.0000 mL | Freq: Three times a day (TID) | ORAL | Status: DC
  Administered 2022-06-14 – 2022-06-15 (×3): 237 mL via ORAL

## 2022-06-14 MED ORDER — IOHEXOL 350 MG/ML SOLN
75.0000 mL | Freq: Once | INTRAVENOUS | Status: AC | PRN
  Administered 2022-06-14: 75 mL via INTRAVENOUS

## 2022-06-14 MED ORDER — ADULT MULTIVITAMIN W/MINERALS CH
1.0000 | ORAL_TABLET | Freq: Every day | ORAL | Status: DC
  Administered 2022-06-14 – 2022-06-15 (×2): 1 via ORAL
  Filled 2022-06-14 (×2): qty 1

## 2022-06-14 NOTE — Progress Notes (Signed)
Occupational Therapy Treatment Patient Details Name: Richard Benson MRN: [id number] DOB: January 21, 1976 Today's Date: 06/14/2022   History of present illness 46 y.o. male presents to [facility] hospital on 06/11/2022 with HA, R facial droop and L weakness. PT also with a history of seizures and demonstrated RUE shaking during EMS trip. CT and CTA negative, pt received tNK. PMH includes blind left eye, CHF, CVA, afib, seizures.   OT comments  Patient continues to make steady progress towards goals in skilled OT session. Patient's session encompassed re-assessment with PT due to inconsistent recommendations. Patient with increased mentation at beginning of session, able to state he lives in a re-entry house, lives on the second floor, but would have assist from "Jenny Reichmann" who runs the home. Patient was able to demonstrate increased cognition to state all his medications and state how many times he takes them without assist from OT. Patient with improved advancement of LLE, ambulating in hallway with close chair follow. Patient then observed to be blinking, huffing, however denying dizziness when ambulating. PT noting one posterior LOB, therefore assisted to recliner with BP assessed at 103/62 (74). Patient began complaining of minimal R frontal headache when seated in recliner, but wanting to continue to ambulate. Patient then attempting to ambulate with PT, however unable to pick up L foot to advance gait without max external assist from PT. Patient then becoming minimally responsive, blinking frequently, and stating increased headache (9/10 R frontal headache). PT and OT rolling patient back into room, and OT assessing patients L arm with increased weakness and decreased grip strength and sensation than noted earlier in session. Patient max A of 2 to pivot back into bed, with RN immediately notified. PT and OT providing report to RN and charge RN, and handed off to their care. OT recommending AIR placement as patient  was previously running his own business, driving, and able to complete all aspects of care independently. OT will continue to follow.    Recommendations for follow up therapy are one component of a multi-disciplinary discharge planning process, led by the attending physician.  Recommendations may be updated based on patient status, additional functional criteria and insurance authorization.    Follow Up Recommendations  Acute inpatient rehab (3hours/day)    Assistance Recommended at Discharge Frequent or constant Supervision/Assistance  Patient can return home with the following  A lot of help with walking and/or transfers;A lot of help with bathing/dressing/bathroom;Direct supervision/assist for medications management;Direct supervision/assist for financial management;Assist for transportation;Help with stairs or ramp for entrance;Assistance with cooking/housework   Equipment Recommendations  BSC/3in1    Recommendations for Other Services Rehab consult    Precautions / Restrictions Precautions Precautions: Fall Restrictions Weight Bearing Restrictions: No       Mobility Bed Mobility Overal bed mobility: Needs Assistance Bed Mobility: Supine to Sit     Supine to sit: Min guard, HOB elevated Sit to supine: Max assist, +2 for safety/equipment, +2 for physical assistance   General bed mobility comments: min guard at start of session with pt taking extra time to bring LE's off EOB and reaching for bed rail to pivot/raist trunk. EOS new onset of Lt UE/LE weakness. Max +2 assist to return to supine and boost in bed.    Transfers Overall transfer level: Needs assistance Equipment used: Rolling walker (2 wheels) Transfers: Sit to/from Stand Sit to Stand: Min assist Stand pivot transfers: Max assist, +2 physical assistance, +2 safety/equipment   Step pivot transfers: Min assist     General transfer comment:  Min assist for rise from EOB and from recliner. pt able to power up and  maintain standing balance at start of session. EOS with onset of Lt sided weakness Pt required 2+ Max assist to stand and pivot to EOB and return to supine.     Balance Overall balance assessment: Needs assistance Sitting-balance support: No upper extremity supported, Feet supported Sitting balance-Leahy Scale: Good Sitting balance - Comments: at start of session pt able to maintain seated balance without support, EOS pt with Lt lateral lean and external support required.   Standing balance support: During functional activity, Bilateral upper extremity supported, Reliant on assistive device for balance Standing balance-Leahy Scale: Poor                             ADL either performed or assessed with clinical judgement   ADL Overall ADL's : Needs assistance/impaired                         Toilet Transfer: Moderate assistance;Stand-pivot Toilet Transfer Details (indicate cue type and reason): patient min-mod A with transfers, mod A at end of session due to code stroke with L > R weakness noted and increased frontal headache         Functional mobility during ADLs: Minimal assistance;Rolling walker (2 wheels);Moderate assistance General ADL Comments: patient min-mod A during session, patient then complaining of R frontal headache, could not advance LLE, became minimally conversant, and OT and PT transitioning back to recliner and returning to bed, RN informed of change, with code stroke called.    Extremity/Trunk Assessment Upper Extremity Assessment Upper Extremity Assessment: Generalized weakness;LUE deficits/detail LUE Deficits / Details: inconsistencies observed in L side strength, pt initially unable to grip with L hand for OT during formal assessment, later able to grip walker consistently and bear weight through left arm without loss of grip or buckling of UE LUE Sensation: decreased light touch LUE Coordination: decreased fine motor;decreased gross motor    Lower Extremity Assessment Lower Extremity Assessment: RLE deficits/detail;LLE deficits/detail RLE Deficits / Details: gross observation EOB, pt able to complete LAQ and dorsiflexion (no overpressure) LLE Deficits / Details: gross observation start of session, pt unable to dorsiflex but able to extend toes sitting EOB. pt able to advance Lt LE through gait, pt advancing Lt LE with hip flexor, extensor tone noted on Lt and foot sliding with pt attempted to extend toes for improved clearance. on second bout of gait Lt LE dragging behind pt and pt unable to advance limb, c/o tingling in plantar surface of foot that worsened. LLE Sensation: decreased proprioception LLE Coordination: decreased gross motor;decreased fine motor   Cervical / Trunk Assessment Cervical / Trunk Assessment: Normal    Vision       Perception     Praxis      Cognition Arousal/Alertness: Awake/alert Behavior During Therapy: WFL for tasks assessed/performed Overall Cognitive Status: Within Functional Limits for tasks assessed                                 General Comments: able to state his medications and his routine prior to code stroke being called in session        Exercises      Shoulder Instructions       General Comments      Pertinent Vitals/ Pain  Pain Assessment Pain Assessment: No/denies pain Pain Score: 9  Pain Location: headache Rt frontal Pain Descriptors / Indicators: Aching, Tingling Pain Intervention(s): Limited activity within patient's tolerance, Monitored during session, Repositioned  Home Living Family/patient expects to be discharged to:: Private residence Living Arrangements: Group Home Available Help at Discharge: Available PRN/intermittently Type of Home: House Home Access: Stairs to enter Secretary/administrator of Steps: 2 Entrance Stairs-Rails: Can reach both Home Layout: Two level;Bed/bath upstairs Alternate Level Stairs-Number of Steps:  16 Alternate Level Stairs-Rails: Can reach both           Home Equipment: Cane - single point   Additional Comments: pt states his wife lives down the street, chart indicates pt is single, states his wife is Ginger. pt reports his parents are not in good health and not very  involved in his present life. pt reports his roommates at group home are able to help him and the manager of the home, "Jonny Ruiz" can help. pt reports he was recently released from prison for a financial crime and that is when he and his wife split, however reports they have been in touch over the last 2 weeks though he is not sure that she is a valid option as support. He is confident in the roommates and manage at his ren-entry group home.  Lives With: Other (Comment) (has roommates, there is a person who manages group home)    Prior Functioning/Environment              Frequency  Min 2X/week        Progress Toward Goals  OT Goals(current goals can now be found in the care plan section)  Progress towards OT goals: Progressing toward goals  Acute Rehab OT Goals Patient Stated Goal: get back home OT Goal Formulation: With patient Time For Goal Achievement: 06/26/22 Potential to Achieve Goals: Good  Plan Discharge plan needs to be updated    Co-evaluation      Reason for Co-Treatment: Complexity of the patient's impairments (multi-system involvement);For patient/therapist safety;To address functional/ADL transfers PT goals addressed during session: Mobility/safety with mobility;Balance;Proper use of DME OT goals addressed during session: ADL's and self-care      AM-PAC OT "6 Clicks" Daily Activity     Outcome Measure   Help from another person eating meals?: None Help from another person taking care of personal grooming?: A Little Help from another person toileting, which includes using toliet, bedpan, or urinal?: A Little Help from another person bathing (including washing, rinsing, drying)?: A  Lot Help from another person to put on and taking off regular upper body clothing?: A Little Help from another person to put on and taking off regular lower body clothing?: A Lot 6 Click Score: 17    End of Session Equipment Utilized During Treatment: Rolling walker (2 wheels);Gait belt  OT Visit Diagnosis: Unsteadiness on feet (R26.81);Other abnormalities of gait and mobility (R26.89);Muscle weakness (generalized) (M62.81);Pain   Activity Tolerance Treatment limited secondary to medical complications (Comment)   Patient Left in bed;with call bell/phone within reach;with nursing/sitter in room   Nurse Communication Mobility status;Other (comment) (new L sided weakness)        Time: 2703-5009 OT Time Calculation (min): 30 min  Charges: OT General Charges $OT Visit: 1 Visit OT Treatments $Self Care/Home Management : 8-22 mins  Pollyann Glen E. Retaj Hilbun, OTR/L Acute Rehabilitation Services [phone]   Cherlyn Cushing 06/14/2022, 3:15 PM

## 2022-06-14 NOTE — Progress Notes (Addendum)
STROKE TEAM PROGRESS NOTE   INTERVAL HISTORY Patient's estranged wife is at bedside on initial evaluation. Due to multiple different discharge recommendations from PT/OT/Speech yesterday, these teams were asked to reevaluate the patient today. Patient endorses at bedside that he does not wish to go to a SNF but he lives with a roommate that would only sometimes possibly be able to assist with patient's care at home.  Neurological exam is unchanged.   Update: at 12:50, a Code Stroke was activated as patient was working with PT. Patient was ambulating in the hall when he suddenly was unable to move his left leg to continue with ambulation, complained of a sudden onset of sharp right temporal headache, and became diaphoretic. Patient was assisted back to the bed and a Code Stroke was activated.   On initial evaluation, patient complained of ongoing headache and left-sided weakness. He stated he was unable to move his left arm and leg at all. When placed above his head, his left arm avoided falling on his head or chest and quickly landed on the bed next to him without any effort of antigravity positioning. His leg also quickly dropped to the bed following passive elevation. NIHSS varied between 4-7, waxed and waned throughout assessment, and was largely effort dependent. Patient was taken to CT imaging and CTH and CTA head and neck reviewed by attending neurologist and was negative. Patient was not given TNK as he is on full dose anticoagulation and his presentation was so variable.  NIHSS breakdown:  LUE- 3 LLE- 3 1 sensory (left arm tingling like pins and needles)  Patient's discharge was held for further therapy recommendations and overnight evaluation.   Vitals:   06/14/22 0346 06/14/22 0724 06/14/22 1113 06/14/22 1249  BP: 116/60 103/62 (!) 95/50 110/64  Pulse: 60 72 67 71  Resp: 17 19 19    Temp: 98.1 F (36.7 C) 97.9 F (36.6 C) 98.5 F (36.9 C) 98.3 F (36.8 C)  TempSrc: Oral Oral Oral  Oral  SpO2: 97% 98% 98% 100%  Weight:      Height:       CBC:  Recent Labs  Lab 06/11/22 1049 06/11/22 1054 06/12/22 0610  WBC 3.9*  --  3.3*  NEUTROABS 2.1  --   --   HGB 8.9* 9.2* 8.2*  HCT 28.6* 27.0* 27.3*  MCV 76.9*  --  78.4*  PLT 221  --  187    Basic Metabolic Panel:  Recent Labs  Lab 06/11/22 1049 06/11/22 1054 06/12/22 0610  NA 138 138 141  K 3.7 3.7 3.6  CL 106 103 107  CO2 23  --  22  GLUCOSE 98 97 133*  BUN 10 10 9   CREATININE 0.86 0.70 0.70  CALCIUM 8.1*  --  7.9*    Lipid Panel:  Recent Labs  Lab 06/12/22 0604  CHOL 79  TRIG 24  HDL 38*  CHOLHDL 2.1  VLDL 5  LDLCALC 36    HgbA1c:  Recent Labs  Lab 06/11/22 1224  HGBA1C 5.2    Urine Drug Screen: No results for input(s): "LABOPIA", "COCAINSCRNUR", "LABBENZ", "AMPHETMU", "THCU", "LABBARB" in the last 168 hours.  Alcohol Level  Recent Labs  Lab 06/11/22 1049  ETH <10     IMAGING past 24 hours CT HEAD CODE STROKE WO CONTRAST  Result Date: 06/14/2022 CLINICAL DATA:  Code stroke.  Neuro deficit, acute, stroke suspected EXAM: CT HEAD WITHOUT CONTRAST CTA HEAD AND NECK TECHNIQUE: Contiguous axial images were obtained from the base  of the skull through the vertex without intravenous contrast. Multidetector CT imaging of the head and neck was performed using the standard protocol during bolus administration of intravenous contrast. Multiplanar CT image reconstructions and MIPs were obtained to evaluate the vascular anatomy. Carotid stenosis measurements (when applicable) are obtained utilizing NASCET criteria, using the distal internal carotid diameter as the denominator. RADIATION DOSE REDUCTION: This exam was performed according to the departmental dose-optimization program which includes automated exposure control, adjustment of the mA and/or kV according to patient size and/or use of iterative reconstruction technique. COMPARISON:  CT head from June 12, 2022. CTA head/neck June 11, 2022.  FINDINGS: CT HEAD Brain: No evidence of acute large vascular territory infarction, hemorrhage, hydrocephalus, extra-axial collection or mass lesion/mass effect. Vascular: See below. Skull: No acute fracture. Sinuses/Orbits: Right sphenoid sinus frothy secretions no acute orbital findings Other: No mastoid effusions. ASPECTS [facility] Stroke Program Early CT Score) total score (0-10 with 10 being normal): 10. CTA NECK Aorta: Great vessel origins are patent. Right carotid system: Patent. No significant (greater than 50%) stenosis. Left carotid system: Patent. No significant (greater than 50%) stenosis. Vertebral arteries: Patent bilaterally. No significant (greater than 50%) stenosis. Upper chest: Visualized lung apices are clear. Other: C5-C6 congenital segmentation anomaly. CTA HEAD: Anterior circulation: Bilateral intracranial ICAs, MCAs, and ACAs are patent without proximal hemodynamically significant stenosis. Posterior circulation: Bilateral intradural vertebral arteries, basilar artery and bilateral posterior cerebral arteries are patent without proximal hemodynamically significant stenosis. Right fetal type PCA, anatomic variant. IMPRESSION: 1. No evidence of acute intracranial abnormality.  ASPECTS is 10. 2. No emergent large vessel occlusion or proximal hemodynamically significant stenosis. Electronically Signed   By: Margaretha Sheffield M.D.   On: 06/14/2022 13:34   CT ANGIO HEAD NECK W WO CM (CODE STROKE)  Result Date: 06/14/2022 CLINICAL DATA:  Code stroke.  Neuro deficit, acute, stroke suspected EXAM: CT HEAD WITHOUT CONTRAST CTA HEAD AND NECK TECHNIQUE: Contiguous axial images were obtained from the base of the skull through the vertex without intravenous contrast. Multidetector CT imaging of the head and neck was performed using the standard protocol during bolus administration of intravenous contrast. Multiplanar CT image reconstructions and MIPs were obtained to evaluate the vascular anatomy.  Carotid stenosis measurements (when applicable) are obtained utilizing NASCET criteria, using the distal internal carotid diameter as the denominator. RADIATION DOSE REDUCTION: This exam was performed according to the departmental dose-optimization program which includes automated exposure control, adjustment of the mA and/or kV according to patient size and/or use of iterative reconstruction technique. COMPARISON:  CT head from June 12, 2022. CTA head/neck June 11, 2022. FINDINGS: CT HEAD Brain: No evidence of acute large vascular territory infarction, hemorrhage, hydrocephalus, extra-axial collection or mass lesion/mass effect. Vascular: See below. Skull: No acute fracture. Sinuses/Orbits: Right sphenoid sinus frothy secretions no acute orbital findings Other: No mastoid effusions. ASPECTS [facility] Stroke Program Early CT Score) total score (0-10 with 10 being normal): 10. CTA NECK Aorta: Great vessel origins are patent. Right carotid system: Patent. No significant (greater than 50%) stenosis. Left carotid system: Patent. No significant (greater than 50%) stenosis. Vertebral arteries: Patent bilaterally. No significant (greater than 50%) stenosis. Upper chest: Visualized lung apices are clear. Other: C5-C6 congenital segmentation anomaly. CTA HEAD: Anterior circulation: Bilateral intracranial ICAs, MCAs, and ACAs are patent without proximal hemodynamically significant stenosis. Posterior circulation: Bilateral intradural vertebral arteries, basilar artery and bilateral posterior cerebral arteries are patent without proximal hemodynamically significant stenosis. Right fetal type PCA, anatomic variant. IMPRESSION: 1. No  evidence of acute intracranial abnormality.  ASPECTS is 10. 2. No emergent large vessel occlusion or proximal hemodynamically significant stenosis. Electronically Signed   By: Feliberto Harts M.D.   On: 06/14/2022 13:34    PHYSICAL EXAM Blind in left eye. Discongjuate gaze tracks. PERRl.    able to name and repeat sensation intact.  Temp:  [97.9 F (36.6 C)-98.7 F (37.1 C)] 98.3 F (36.8 C) (11/06 1249) Pulse Rate:  [59-72] 71 (11/06 1249) Resp:  [16-20] 19 (11/06 1113) BP: (95-116)/(50-70) 110/64 (11/06 1249) SpO2:  [97 %-100 %] 100 % (11/06 1249)  General - Well nourished, well developed, in no apparent distress. Cardiovascular - Regular rhythm and rate.  Mental Status -  Level of arousal and orientation to time, place, and person were intact. Language including expression, naming, repetition, comprehension was assessed and found intact. Attention span and concentration were normal. Recent and remote memory were intact. Fund of Knowledge was assessed and was intact.  Cranial Nerves II - XII - II - Visual field intact OU.blind in left eye at baseline III, IV, VI - Extraocular movements intact, tracks. Disconjugate gaze  V - Facial sensation intact bilaterally. VII - Facial movement intact bilaterally. VIII - Hearing & vestibular intact bilaterally. X - Palate elevates symmetrically. XI - Chin turning & shoulder shrug intact bilaterally. XII - Tongue protrusion intact.  Motor Strength - Left arm able to lift antigravity but drifts to bed. Left leg able to lift but drifts down, effort is poor and variable right arm and leg 5/5 Motor Tone - Muscle tone was assessed at the neck and appendages and was normal.  Sensory - tingling pins and needle sensation to the left arm   Coordination - The patient had normal movements in the hands and feet with no ataxia or dysmetria.  Tremor was absent.  Gait and Station - deferred.  ASSESSMENT/PLAN Mr. ALECXIS BALTZELL is a 46 y.o. 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  At approximately 0845 this morning he developed a  severe headache, right facial droop, and left sided weakness. He had previously been in his usual state of health and been able to go to the grocery store. He states that he has been on Xarelto since 2014, but has not taken it in a week due to running out. He has been compliant with his seizure medications. He reports a 9/10 right temporal headache reporting nausea, photophobia, sonophobia, and osmophobia. Additionally he does have a history of seizures and did demonstrate upper extremity shaking with head shaking during the EMS trip lasting approximately 10 seconds.    Complicated Migraine vs Stroke s/p TNK  CT head x2 negative for stroke.  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  ASPECTS 10.    CTA head & neck Normal vasculature of the head and neck with no hemodynamically significant stenosis, occlusion, or dissection CT perfusion No infarct core or penumbra on CT perfusion  24 hr s/p TNK CT head negative for acute process MRI  unable to obtain due to PPM not compatible with MRI.  2D Echo EF 60-65% Trivial mitral valve regurgitation  LDL 36 HgbA1c 5.2 VTE prophylaxis - SCD's    Diet   Diet regular Room service appropriate? Yes with Assist; Fluid  consistency: Thin   Xarelto (rivaroxaban) daily prior to admission, now on Xarelto (rivaroxaban) daily.  Therapy recommendations:  pending further recommendations, initially variable recommendations across therapy teams Disposition:  pending  Hypertension Hypotension  Home meds:  none UnStable Hypotensive during admission requiring IVF bolus.  Long-term BP goal normotensive  Hyperlipidemia Home meds:  liptior 40mg , resumed in hospital LDL 36, goal < 70 Continue statin at discharge  P Atrial Fib  On Xarelto at home. Was out of medication off ~ 1 week  Restarted home Xarelto  Seizure hx  Seizure precautions  On keppra, will continue  EEG This study is within normal limits. No  seizures or epileptiform discharges were seen throughout the recording.   Other Stroke Risk Factors Former Cigarette smoker Hx stroke/TIA Coronary artery disease Migraines Obstructive sleep apnea, on CPAP at home Congestive heart failure  Other Active Problems Anemia Hgb 8.2  Hospital day # 3  I have personally obtained history,examined this patient, reviewed notes, independently viewed imaging studies, participated in medical decision making and plan of care.ROS completed by me personally and pertinent positives fully documented  I have made any additions or clarifications directly to the above note. Agree with note above.  Patient was initially seen at a.m. rounds and appeared to do fine and wanted to go home and I discussed with him therapy recommendations going to SNF but patient refused.  His estranged wife was with him at that time.  Subsequently while he was ambulating with physical therapy complaining of right temporal headache and sweating and left-sided weakness.  Code stroke was activated evaluated the patient he seemed to have a normal or getting pattern of weakness of the left side with inconsistencies including variable effort.  Stat CT scan of the head was obtained which showed no acute abnormality or bleed.  CT angiogram of the brain and neck also did not show any large vessel occlusion or stenosis.  Patient was given positive reinforcement and his symptoms gradually improved.  Patient was told he would not be discharged and will likely have to consider going to skilled nursing facility or inpatient rehab.  Greater than 50% time during this 50-minute visit was spent in counseling and coordination of care about his symptoms of neurological worsening and in evaluating treating and answering questions  , MD Medical Director [facility] Pager: [phone] 06/14/2022 3:43 PM   To contact Stroke Continuity provider, please refer to 13/01/2022. After hours,  contact General Neurology

## 2022-06-14 NOTE — Plan of Care (Signed)
  Problem: Coping: Goal: Will verbalize positive feelings about self Outcome: Progressing Goal: Will identify appropriate support needs Outcome: Progressing   Problem: Nutrition: Goal: Adequate nutrition will be maintained Outcome: Progressing   Problem: Pain Managment: Goal: General experience of comfort will improve Outcome: Progressing   Problem: Safety: Goal: Ability to remain free from injury will improve Outcome: Progressing   Problem: Skin Integrity: Goal: Risk for impaired skin integrity will decrease Outcome: Progressing

## 2022-06-14 NOTE — Progress Notes (Signed)
Mobility Specialist Progress Note   06/14/22 1253  Mobility  Activity Contraindicated/medical hold   Pt not appropriate for at this time given level of complexity, physical assist, and/or precautions as advised by PT/OT. Initiated code stroke during PT/OT evaluation. MS to hold, will continue to follow for readiness.  [place] Kenidee Cregan, [place], [place]  Office: [phone]
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
## 2022-06-14 NOTE — Progress Notes (Signed)
Initial Nutrition Assessment  DOCUMENTATION CODES:   Severe malnutrition in context of chronic illness  INTERVENTION:  - Add Ensure Enlive po QID, each supplement provides 350 kcal and 20 grams of protein.   - Add MVI q day.   NUTRITION DIAGNOSIS:   Severe Malnutrition related to chronic illness as evidenced by energy intake < or equal to 75% for > or equal to 1 month, moderate muscle depletion, mild fat depletion, percent weight loss (20% wt loss in 5 months).  GOAL:   Patient will meet greater than or equal to 90% of their needs  MONITOR:   PO intake, Supplement acceptance  REASON FOR ASSESSMENT:   Malnutrition Screening Tool    ASSESSMENT:   46 y.o. male admits related to left sided weakness. PMH includes: afib, stroke, seizures, heart failure, blind in left eye. Pt is currently receiving medical management for complicated migraine vs Stroke s/p TNK.  Meds include: VitD3, Vit B12. Labs reviewed.   The pt reports that he does not have much of an appetite and did not eat much of his lunch. He states that he has had a poor appetite since June. Pt reports that he has lost a significant amount of weight since June. He states that in June 2023 he weighed approximately 225 lbs. Pt has experienced a 20% wt loss in 5 months, which is significant. Pt states that he was trying to drink about 5 ensures each day at home. RD will add 4 Ensure each day for pt to see how he tolerates. Will continue to monitor PO intakes.   NUTRITION - FOCUSED PHYSICAL EXAM:  Flowsheet Row Most Recent Value  Orbital Region Mild depletion  Upper Arm Region Mild depletion  Thoracic and Lumbar Region Mild depletion  Buccal Region Mild depletion  Temple Region Moderate depletion  Clavicle Bone Region Moderate depletion  Clavicle and Acromion Bone Region Mild depletion  Scapular Bone Region Unable to assess  Dorsal Hand Mild depletion  Patellar Region Mild depletion  Anterior Thigh Region Mild depletion   Posterior Calf Region Mild depletion  Edema (RD Assessment) None  Hair Reviewed  Eyes Reviewed  Mouth Reviewed  Skin Reviewed  Nails Reviewed       Diet Order:   Diet Order             Diet regular Room service appropriate? Yes with Assist; Fluid consistency: Thin  Diet effective now                   EDUCATION NEEDS:   Not appropriate for education at this time  Skin:  Skin Assessment: Reviewed RN Assessment  Last BM:  06/12/22  Height:   Ht Readings from Last 1 Encounters:  06/11/22 5\' 11"  (1.803 m)    Weight:   Wt Readings from Last 1 Encounters:  06/11/22 81 kg    Ideal Body Weight:  78.2 kg  BMI:  Body mass index is 24.91 kg/m.  Estimated Nutritional Needs:   Kcal:  1610-9604 kcals  Protein:  100-120 gm  Fluid:  >/= 2 L  Thalia Bloodgood, RD, LDN, CNSC.
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
## 2022-06-14 NOTE — Progress Notes (Signed)
   Inpatient Rehab Admissions Coordinator :  Per therapy change in recommendations, patient was screened for CIR candidacy by Danne Baxter RN MSN.  At this time patient appears to be most appropriate for SNF. Also question medical necessity for AIR level rehab. We will not pursue CIR admit at this time.  Danne Baxter RN MSN Admissions Coordinator [phone]

## 2022-06-14 NOTE — Code Documentation (Signed)
Stroke Response Nurse Documentation Code Documentation  Richard Benson is a 46 y.o. male admitted to [facility]  on 06/11/2022 for Code Stroke etiology unknown with past medical hx of CHF, Stroke, Left Eye Blindness. Code stroke was activated by 3W RN .   Patient on 3W where he was LKW at 1245 and now complaining of worsening left sided weakness and numbness while trying to ambulate.   Stroke team at the bedside after patient activation. Patient to CT with team. NIHSS 7, see documentation for details and code stroke times. Patient with left arm weakness, left leg weakness, and left decreased sensation on exam. The following imaging was completed:  CT Head and CTA. Patient is not a candidate for IV Thrombolytic due to stroke not suspected. Patient is not a candidate for IR due to no LVO.   Care/Plan: continue Q4 NIHSS/VS.   Bedside handoff with RN Steffanie Dunn.    Kathrin Greathouse  Stroke Response RN

## 2022-06-14 NOTE — Progress Notes (Signed)
RN called to room by PT stating pt was unable to walk and weaker on his left side. Per PT, pt had been conversing appropriately and walking with assistance when he became minimally responsive and weaker on his left side, unable to move his left leg. Upon RN arrival to room, pt lying in bed, diaphoretic, c/o headache and double blurry vision. Code Stroke called. Jarrett Soho, stroke response nurse and Dr Leonie Man to bedside. See new orders for CT.

## 2022-06-15 NOTE — Progress Notes (Addendum)
The beneficiary is confined to one level of the home environment and there is no toilet available.

## 2022-06-15 NOTE — TOC Progression Note (Signed)
Transition of Care [facility]) - Progression Note    Patient Details  Name: Richard Benson MRN: [id number] Date of Birth: 29-Mar-1976  Transition of Care [facility]) CM/SW [place], [place] Phone Number: 06/15/2022, 10:31 AM  Clinical Narrative:     CSW was advised by medical team that pt had an episode of weakness yesterday, and that he may need to consider another level of care. CSW met with pt and ex- wife at bedside. Pt advised he will go home with ex wife to care for him. He is agreeable to OP PT, near where he lives in [place]. Pt states his PCP is at [facility]. Pt is now agreeable to DME. CSW consulted CM to assist in ordering a walker and 3n1, as well as OP therapy. CSW updated medical team, and advised pt is wanting to discharge today. Ex wife at bedside to transport pt when ready. TOC will be available for any further needs.  Expected Discharge Plan: Homeless Shelter Barriers to Discharge: Inadequate or no insurance, Continued Medical Work up, Higher education careers adviser and Services Expected Discharge Plan: Homeless Shelter     Post Acute Care Choice: Resumption of Svcs/PTA Provider Living arrangements for the past 2 months: Homeless Shelter                                       Social Determinants of Health (SDOH) Interventions    Readmission Risk Interventions     No data to display

## 2022-09-27 DIAGNOSIS — R300 Dysuria: Secondary | ICD-10-CM | POA: Diagnosis not present
